# Patient Record
Sex: Male | Born: 1955 | Race: White | Hispanic: No | Marital: Married | Smoking: Current every day smoker
Health system: Southern US, Community
[De-identification: ages and names within clinical notes are randomized; demographics above are authoritative.]

## PROBLEM LIST (undated history)

## (undated) DIAGNOSIS — Z974 Presence of external hearing-aid: Secondary | ICD-10-CM

## (undated) DIAGNOSIS — I2510 Atherosclerotic heart disease of native coronary artery without angina pectoris: Secondary | ICD-10-CM

## (undated) DIAGNOSIS — I10 Essential (primary) hypertension: Secondary | ICD-10-CM

## (undated) DIAGNOSIS — I739 Peripheral vascular disease, unspecified: Secondary | ICD-10-CM

## (undated) DIAGNOSIS — M1990 Unspecified osteoarthritis, unspecified site: Secondary | ICD-10-CM

## (undated) DIAGNOSIS — I219 Acute myocardial infarction, unspecified: Secondary | ICD-10-CM

## (undated) DIAGNOSIS — D649 Anemia, unspecified: Secondary | ICD-10-CM

## (undated) HISTORY — DX: Essential (primary) hypertension: I10

## (undated) HISTORY — DX: Unspecified osteoarthritis, unspecified site: M19.90

## (undated) HISTORY — DX: Atherosclerotic heart disease of native coronary artery without angina pectoris: I25.10

---

## 2003-11-20 DIAGNOSIS — D649 Anemia, unspecified: Secondary | ICD-10-CM

## 2003-11-20 HISTORY — DX: Anemia, unspecified: D64.9

## 2010-07-27 ENCOUNTER — Ambulatory Visit: Payer: Self-pay | Admitting: Internal Medicine

## 2012-02-24 ENCOUNTER — Emergency Department: Payer: Self-pay | Admitting: Emergency Medicine

## 2013-11-19 DIAGNOSIS — I219 Acute myocardial infarction, unspecified: Secondary | ICD-10-CM

## 2013-11-19 HISTORY — DX: Acute myocardial infarction, unspecified: I21.9

## 2013-11-19 HISTORY — PX: CORONARY ANGIOPLASTY WITH STENT PLACEMENT: SHX49

## 2014-05-20 ENCOUNTER — Inpatient Hospital Stay: Payer: Self-pay | Admitting: Internal Medicine

## 2014-05-20 DIAGNOSIS — I214 Non-ST elevation (NSTEMI) myocardial infarction: Secondary | ICD-10-CM

## 2014-05-20 DIAGNOSIS — I2510 Atherosclerotic heart disease of native coronary artery without angina pectoris: Secondary | ICD-10-CM

## 2014-05-20 LAB — CBC
HCT: 41.4 % (ref 40.0–52.0)
HGB: 13.9 g/dL (ref 13.0–18.0)
MCH: 32.1 pg (ref 26.0–34.0)
MCHC: 33.5 g/dL (ref 32.0–36.0)
MCV: 96 fL (ref 80–100)
Platelet: 213 10*3/uL (ref 150–440)
RBC: 4.32 10*6/uL — ABNORMAL LOW (ref 4.40–5.90)
RDW: 13.6 % (ref 11.5–14.5)
WBC: 12 10*3/uL — AB (ref 3.8–10.6)

## 2014-05-20 LAB — CK TOTAL AND CKMB (NOT AT ARMC)
CK, Total: 244 U/L
CK-MB: 24.3 ng/mL — ABNORMAL HIGH (ref 0.5–3.6)

## 2014-05-20 LAB — COMPREHENSIVE METABOLIC PANEL
ALT: 20 U/L (ref 12–78)
ANION GAP: 4 — AB (ref 7–16)
Albumin: 3.8 g/dL (ref 3.4–5.0)
Alkaline Phosphatase: 63 U/L
BUN: 13 mg/dL (ref 7–18)
Bilirubin,Total: 0.5 mg/dL (ref 0.2–1.0)
CHLORIDE: 108 mmol/L — AB (ref 98–107)
CO2: 26 mmol/L (ref 21–32)
Calcium, Total: 9.1 mg/dL (ref 8.5–10.1)
Creatinine: 0.85 mg/dL (ref 0.60–1.30)
EGFR (Non-African Amer.): >60
Glucose: 140 mg/dL — ABNORMAL HIGH (ref 65–99)
OSMOLALITY: 278 (ref 275–301)
Potassium: 3.9 mmol/L (ref 3.5–5.1)
SGOT(AST): 30 U/L (ref 15–37)
SODIUM: 138 mmol/L (ref 136–145)
Total Protein: 7.5 g/dL (ref 6.4–8.2)

## 2014-05-20 LAB — LIPID PANEL
Cholesterol: 187 mg/dL (ref 0–200)
HDL Cholesterol: 46 mg/dL (ref 40–60)
Ldl Cholesterol, Calc: 125 mg/dL — ABNORMAL HIGH (ref 0–100)
TRIGLYCERIDES: 81 mg/dL (ref 0–200)
VLDL Cholesterol, Calc: 16 mg/dL (ref 5–40)

## 2014-05-20 LAB — CK-MB: CK-MB: 241.5 ng/mL — AB (ref 0.5–3.6)

## 2014-05-20 LAB — TROPONIN I
TROPONIN-I: 1.2 ng/mL — AB
Troponin-I: 29 ng/mL — ABNORMAL HIGH

## 2014-05-20 LAB — HEMOGLOBIN A1C: Hemoglobin A1C: 5.9 % (ref 4.2–6.3)

## 2014-05-21 LAB — BASIC METABOLIC PANEL
Anion Gap: 10 (ref 7–16)
BUN: 7 mg/dL (ref 7–18)
CO2: 22 mmol/L (ref 21–32)
Calcium, Total: 8.7 mg/dL (ref 8.5–10.1)
Chloride: 105 mmol/L (ref 98–107)
Creatinine: 0.68 mg/dL (ref 0.60–1.30)
EGFR (African American): >60
EGFR (Non-African Amer.): >60
GLUCOSE: 106 mg/dL — AB (ref 65–99)
OSMOLALITY: 272 (ref 275–301)
Potassium: 3.7 mmol/L (ref 3.5–5.1)
Sodium: 137 mmol/L (ref 136–145)

## 2014-05-21 LAB — CK TOTAL AND CKMB (NOT AT ARMC)
CK, Total: 2257 U/L — ABNORMAL HIGH
CK-MB: 276.1 ng/mL — ABNORMAL HIGH (ref 0.5–3.6)

## 2014-05-24 ENCOUNTER — Encounter: Payer: Self-pay | Admitting: Cardiovascular Disease

## 2014-05-24 DIAGNOSIS — E782 Mixed hyperlipidemia: Secondary | ICD-10-CM | POA: Insufficient documentation

## 2014-05-24 DIAGNOSIS — Z720 Tobacco use: Secondary | ICD-10-CM | POA: Insufficient documentation

## 2014-05-24 DIAGNOSIS — I252 Old myocardial infarction: Secondary | ICD-10-CM | POA: Insufficient documentation

## 2014-06-30 ENCOUNTER — Encounter: Payer: Self-pay | Admitting: Cardiovascular Disease

## 2015-01-11 ENCOUNTER — Emergency Department: Payer: Self-pay | Admitting: Emergency Medicine

## 2015-03-12 NOTE — Consult Note (Signed)
PATIENT NAME:  Gavin Mendez, NULPH MR#:  [id number] DATE OF BIRTH:  14-Jan-1956  DATE OF CONSULTATION:  05/20/2014  REFERRING PHYSICIAN:  Janalyn Harder, MD CONSULTING PHYSICIAN:  Lamar Blinks, MD  REASON FOR CONSULTATION: Acute subendocardial myocardial infarction, chest pain and tobacco use.   CHIEF COMPLAINT: "I have shortness of breath and weakness."  HISTORY OF PRESENT ILLNESS: This is a 59 year old male with significant tobacco use, borderline hypertension, hyperlipidemia and family history of cardiovascular disease who has had new onset of back pain, bilateral arm pain, weakness, shortness of breath, nausea, and diaphoresis over a 3 to 4 hour period throughout the night, slightly improved at this time, multifactorial in nature, although most consistent with acute subendocardial myocardial infarction with a troponin of 1.2 and an EKG showing normal sinus rhythm with slight nonspecific elevation of anterior precordial leads in the ST segments. The patient is much better at this time but still has some right-sided discomfort.   REVIEW OF SYSTEMS: The remainder of review is negative for vision change, ringing in the ears, hearing loss, cough, congestion, heartburn, diarrhea, bloody stool, stomach pain, extremity pain, leg weakness, cramping of the buttocks, known blood clots, headaches, blackouts, dizzy spells, nosebleeds, congestion, trouble swallowing, frequent urination, urination at night, muscle weakness, numbness, anxiety, depression, skin lesions or skin rashes.   PAST MEDICAL HISTORY: Borderline hypertension and borderline hyperlipidemia.   FAMILY HISTORY: Multiple family members with early onset of cardiovascular disease and stenting.   SOCIAL HISTORY: Smokes 1 to 2 packs per day. No alcohol use.   ALLERGIES: As listed.   MEDICATIONS: As listed.   PHYSICAL EXAMINATION: VITAL SIGNS: Blood pressure is 148/62 bilaterally and heart rate 72 upright and reclining and regular.  GENERAL:  He is a well appearing male in no acute distress.  HEENT: No icterus, thyromegaly, ulcers, hemorrhage, or xanthelasma.  CARDIOVASCULAR: Regular rate and rhythm. Normal S1 and S2 without murmur, gallop or rub. PMI is normal size and placement. Carotid upstroke normal without bruit. Jugular venous pressure is normal.  LUNGS: Clear to auscultation with normal respirations.  ABDOMEN: Soft and nontender without hepatosplenomegaly or masses. Abdominal aorta is normal size without bruit.  EXTREMITIES: 2+ radial, femoral, and dorsal pedal pulses with no lower extremity edema, cyanosis, clubbing or ulcers.  NEUROLOGIC: He is oriented to time, place, and person with normal mood and affect.   ASSESSMENT: A 59 year old male with acute subendocardial myocardial infarction, borderline hypertension and hyperlipidemia needing further medication management.   RECOMMENDATIONS: 1.  Heparin, aspirin and nitrates for hypertension and acute myocardial infarction.  2.  Serial ECG and enzymes to assess extent of myocardial infarction.  3.  Proceed to cardiac catheterization to assess coronary anatomy and further treatment thereof as necessary. The patient understands the risks and benefits of cardiac catheterization. These include the possibility of death, stroke, heart attack, infection, bleeding, and blood clot. He is at low risk for conscious sedation.    ____________________________ Lamar Blinks, MD bjk:sb D: 05/20/2014 10:27:36 ET T: 05/20/2014 10:47:25 ET JOB#: [id number]  cc: Lamar Blinks, MD, <Dictator> Lamar Blinks MD ELECTRONICALLY SIGNED 05/24/2014 13:22

## 2015-03-12 NOTE — Discharge Summary (Signed)
PATIENT NAME:  Gavin Mendez, Gavin Mendez MR#:  [id number] DATE OF BIRTH:  [birth year]  DATE OF ADMISSION:  05/20/2014 DATE OF DISCHARGE:  05/21/2014  DISCHARGE DIAGNOSES:  1.  Non-ST-elevated myocardial infarction. 2.  Left anterior descending and first diagonal occlusion post stents.  3.  Tobacco abuse.   DISCHARGE MEDICATIONS: Per [facility] medication reconciliation. Basically, he will be on Lipitor, Toprol-XL, lisinopril, aspirin, and 10 mg of Effient tablet.   HISTORY AND PHYSICAL: Please see detailed history and physical on admission.  HOSPITAL COURSE:   The patient was admitted with bilateral arm pain, shortness of breath, ruled in for myocardial infarction. Cardiology was consulted. Cardiac catheter was done with the above being found since being put in. EF 35%-40% by echocardiogram, so long acting metoprolol and lisinopril were begun. He will slowly increase his activity.  He will follow up soon with cardiology for further evaluation and treatment above.     ____________________________ Marya Amsler. Dareen Piano, MD mwa:ts D: 05/21/2014 09:29:04 ET T: 05/21/2014 19:52:21 ET JOB#: [id number]  cc: Marya Amsler. Dareen Piano, MD, <Dictator> Lauro Regulus MD ELECTRONICALLY SIGNED 05/25/2014 7:47

## 2015-03-12 NOTE — H&P (Signed)
PATIENT NAME:  Gavin Mendez, Gavin Mendez MR#:  [id number] DATE OF BIRTH:  08/23/1956  DATE OF ADMISSION:  05/20/2014  PRIMARY CARE PHYSICIAN: Marya Amsler. Dareen Piano, MD  CHIEF COMPLAINT: Bilateral arm pain with nausea and diaphoresis today.   HISTORY OF PRESENT ILLNESS: Gavin Mendez is a 59 year old Caucasian gentleman who has history of hypertension, not on any medications, who comes in after he woke up in the middle of the night complaining of bilateral arm pain along with nausea, diaphoresis, and could not go back to sleep. He was very anxious when he came to the Emergency Room. He ruled in for non-STEMI. He has troponin of 1.20. The patient denies any chest pain per se. No shortness of breath. He did have some nausea earlier. He received aspirin, nitroglycerin, and I have ordered for him to get some beta blockers. The patient is going to be taken to the cardiac catheterization lab by Dr. Gwen Pounds later this afternoon. Lovenox has not been administered.   PAST MEDICAL HISTORY: Hypertension.   MEDICATIONS: None.   ALLERGIES: FLU VACCINE.   FAMILY HISTORY: Positive for stroke, diabetes and hypertension.   SOCIAL HISTORY: Continues to smoke about 1/2 to 1 pack a day. I have advised him on smoking cessation, about 3 minutes spent. He does seem to be motivated. Denies any alcohol or any other drug use.   REVIEW OF SYSTEMS:  CONSTITUTIONAL: No fever, fatigue, weakness.  EYES: No blurred or double vision, glaucoma or cataracts.  ENT: No tinnitus, ear pain, hearing loss.  RESPIRATORY: No cough, wheeze, hemoptysis or dyspnea.  CARDIOVASCULAR: No chest pain, orthopnea, edema or arrhythmia.  GASTROINTESTINAL: No nausea, vomiting, diarrhea, abdominal pain or GERD.  GENITOURINARY: No dysuria, hematuria or frequency.  ENDOCRINE: No polyuria, nocturia or thyroid problems.  HEMATOLOGY: No anemia or easy bruising or bleeding.  SKIN: No acne, rash, lesion.  MUSCULOSKELETAL: No arthritis, cramps, gout or swelling.   NEUROLOGIC: No CVA, TIA, seizures or dementia.  PSYCHIATRIC: No anxiety, insomnia, depression or bipolar disorder.  All other systems reviewed and negative.   PHYSICAL EXAMINATION:  GENERAL: The patient is awake, alert, oriented x3.  VITAL SIGNS: Afebrile, pulse is 59, blood pressure is 145/83, saturations are 99% on room air.  HEENT: Atraumatic, normocephalic. Pupils are equal, round and reactive to light and accommodation. EOM intact. Oral mucosa is moist.  NECK: Supple. No JVD. No carotid bruit.  RESPIRATORY: Clear to auscultation bilaterally. No rales, rhonchi, respiratory distress or labored breathing.  CARDIOVASCULAR: Both the heart sounds are normal. Rate and rhythm regular. PMI not lateralized. Chest nontender. Good pedal pulses, good femoral pulses. No lower extremity edema.  SKIN: Warm and dry.  PSYCHIATRIC: The patient is awake, alert, oriented x3.   DIAGNOSTIC DATA: EKG shows normal sinus rhythm with mild ST-T changes in lateral leads.  Troponin is 1.20. CPK MB is 24.3.  Chest x-ray: No active cardiopulmonary disease.  Glucose is 140.  H and H is 13.9 and 41.4.   ASSESSMENT AND PLAN: The 59 year old Gavin Mendez with history of hypertension, not on any medicines, is being admitted for:   1. Acute non-ST elevation myocardial infarction. The patient came in with bilateral arm pain, nausea, diaphoresis and has ruled in for non-ST elevation myocardial infarction. His first troponin is 1.20. Will continue aspirin, nitro paste, p.r.n. morphine and put the patient on Lipitor. Metoprolol has been started. The patient was seen by Dr. Gwen Pounds and is scheduled to get cardiac catheterization. His Lovenox. Hence, Lovenox is held. Will continue to monitor cardiac  enzymes x3.  2. Hyperglycemia. No history of diabetes; however, the patient does have family history of diabetes. Will do sliding scale for insulin coverage at this time. Check A1c. If it is elevated, consider starting the patient  on antidiabetic.  3. Hypertension, currently on beta blockers.  4. Deep vein thrombosis prophylaxis. Will start either heparin or Lovenox depending on catheterization results.  5. Further workup according to the patient's clinical course.   Hospital admission plan was discussed with the patient, who is agreeable to it.   TIME SPENT: 50 minutes.   ____________________________ Wylie Hail Allena Katz, MD sap:lb D: 05/20/2014 11:02:54 ET T: 05/20/2014 11:15:18 ET JOB#: [id number]  cc: [name] A. Allena Katz, MD, <Dictator> Marya Amsler. Dareen Piano, MD Willow Ora MD ELECTRONICALLY SIGNED 05/22/2014 10:35
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
## 2015-03-20 NOTE — Consult Note (Signed)
PATIENT NAME:  Gavin Mendez, COFFIELD MR#:  [id number] DATE OF BIRTH:  Mar 03, 1956  DATE OF CONSULTATION:  01/11/2015  REFERRING PHYSICIAN:   CONSULTING PHYSICIAN:  Lamar Blinks, MD  CONSULTING PHYSICIAN:  [place]  REASON FOR CONSULTATION: Syncope with supraventricular tachycardia, coronary artery disease, and cardiomyopathy.   CHIEF COMPLAINT:  Passed out and was dizzy.   HISTORY OF PRESENT ILLNESS: This is a 59 year old male with known coronary artery disease status post coronary artery stenting and anterior myocardial infarction with a recent echocardiogram showing mild to moderate LV systolic dysfunction with anterior and apical hypokinesis. The patient has been on appropriate ACE inhibitor and beta blocker as well as antiplatelet medication management and doing well. Recently he had a significant episode of dehydration and diarrhea and concerns of food poisoning for which he had had some concerns on Monday. He did not hydrate quite as well, but was doing a little bit better. 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 At that time he was taken to the hospital and at that time an EKG showed normal sinus rhythm, normal EKG, but there were some rhythm strips did show some supraventricular tachycardia which were narrow complex at approximately 120 beats per minute and very short lived. These have completely abated at this time after the patient has had continued IV hydration over 1.5 liters. He has not had any chest pain or other significant symptoms and he has no concerns of heart failure, and he is feeling much better. The patient does have normal troponin at this time.   REVIEW OF SYSTEMS: The remainder of review of systems negative for vision change, ringing in the ears, hearing loss, cough, congestion, heartburn, nausea,  vomiting, diarrhea, bloody stools, stomach pain, extremity pain, leg weakness, cramping of the buttocks, known blood clots, headaches, nosebleeds, congestion, trouble swallowing, frequent urination, urination at night, muscle weakness, numbness, anxiety, depression, skin lesions, or skin rashes.   PAST MEDICAL HISTORY:  1. Coronary artery disease status post stenting and myocardial infarction.  2. Mild dilated cardiomyopathy.  3. Hyperlipidemia.   FAMILY HISTORY: Multiple family history with hypertension and coronary artery disease.   SOCIAL HISTORY: Currently does have some alcohol use and rarely drinks.  Is having tobacco use.   ALLERGIES: AS LISTED.   MEDICATIONS: As listed.   PHYSICAL EXAMINATION:  VITAL SIGNS: Blood pressure is 122/68 bilaterally, was lower in the  105 region earlier, heart rate is 76 upright, reclining, and regular.  GENERAL: He is a well-appearing male in no acute distress.  HEENT: No icterus, thyromegaly, ulcers, hemorrhage, or xanthelasma.  CARDIOVASCULAR: Regular rate and rhythm. Normal S1, S2 without murmur, gallop, or rub. PMI is diffuse. Carotid upstroke normal without bruit. Jugular venous pressure is normal.  LUNGS: Clear to auscultation with normal respirations.  ABDOMEN: Soft, nontender, without hepatosplenomegaly or masses. Abdominal aorta is normal size without bruit.  EXTREMITIES: Show 2 + radial, femoral, dorsal pedal pulses with no lower extremity edema, cyanosis, clubbing. or ulcers.  NEUROLOGIC: He is oriented to time, place, and person, with normal mood and affect.   ASSESSMENT: A 59 year old male with known coronary disease status post percutaneous coronary intervention and stent placement, anterior apical myocardial infarction, with mild to moderate left ventricular systolic dysfunction on appropriate medications with dehydration and medication management causing dizziness and syncope,  now recovered without evidence of myocardial infarction    RECOMMENDATIONS:  1.  Discontinue lisinopril during periods of time now that the patient has dehydration and weakness.  2.  Continue observation for the next hour for any supraventricular tachycardia. If completely resolved and the patient is rehydrated and ambulating well he may be able to be discharged home with followup with Dr. Gwen Pounds on Friday.  3.  Continue metoprolol for any tachycardia or other cardiomyopathy type symptoms. 4.  No further cardiac intervention or diagnostics necessary at this time with normal troponin. 5.  Continue antiplatelet medication management for further risk reduction of in-stent thrombosis.  6.  Lipid management for high intensity cholesterol therapy.     ____________________________ Lamar Blinks, MD bjk:bu D: 01/11/2015 17:18:49 ET T: 01/11/2015 20:47:04 ET JOB#: [id number]  cc: Lamar Blinks, MD, <Dictator> Lamar Blinks MD ELECTRONICALLY SIGNED 01/14/2015 8:31

## 2015-04-22 DIAGNOSIS — I10 Essential (primary) hypertension: Secondary | ICD-10-CM | POA: Insufficient documentation

## 2015-10-18 IMAGING — CR DG CHEST 1V PORT
1 series · 1 of 1 positions shown · non-contrast
Comparison: 05/20/2014

CLINICAL DATA: Dizziness, syncope, irregular heart rate

EXAM:
PORTABLE CHEST - 1 VIEW

[ap]
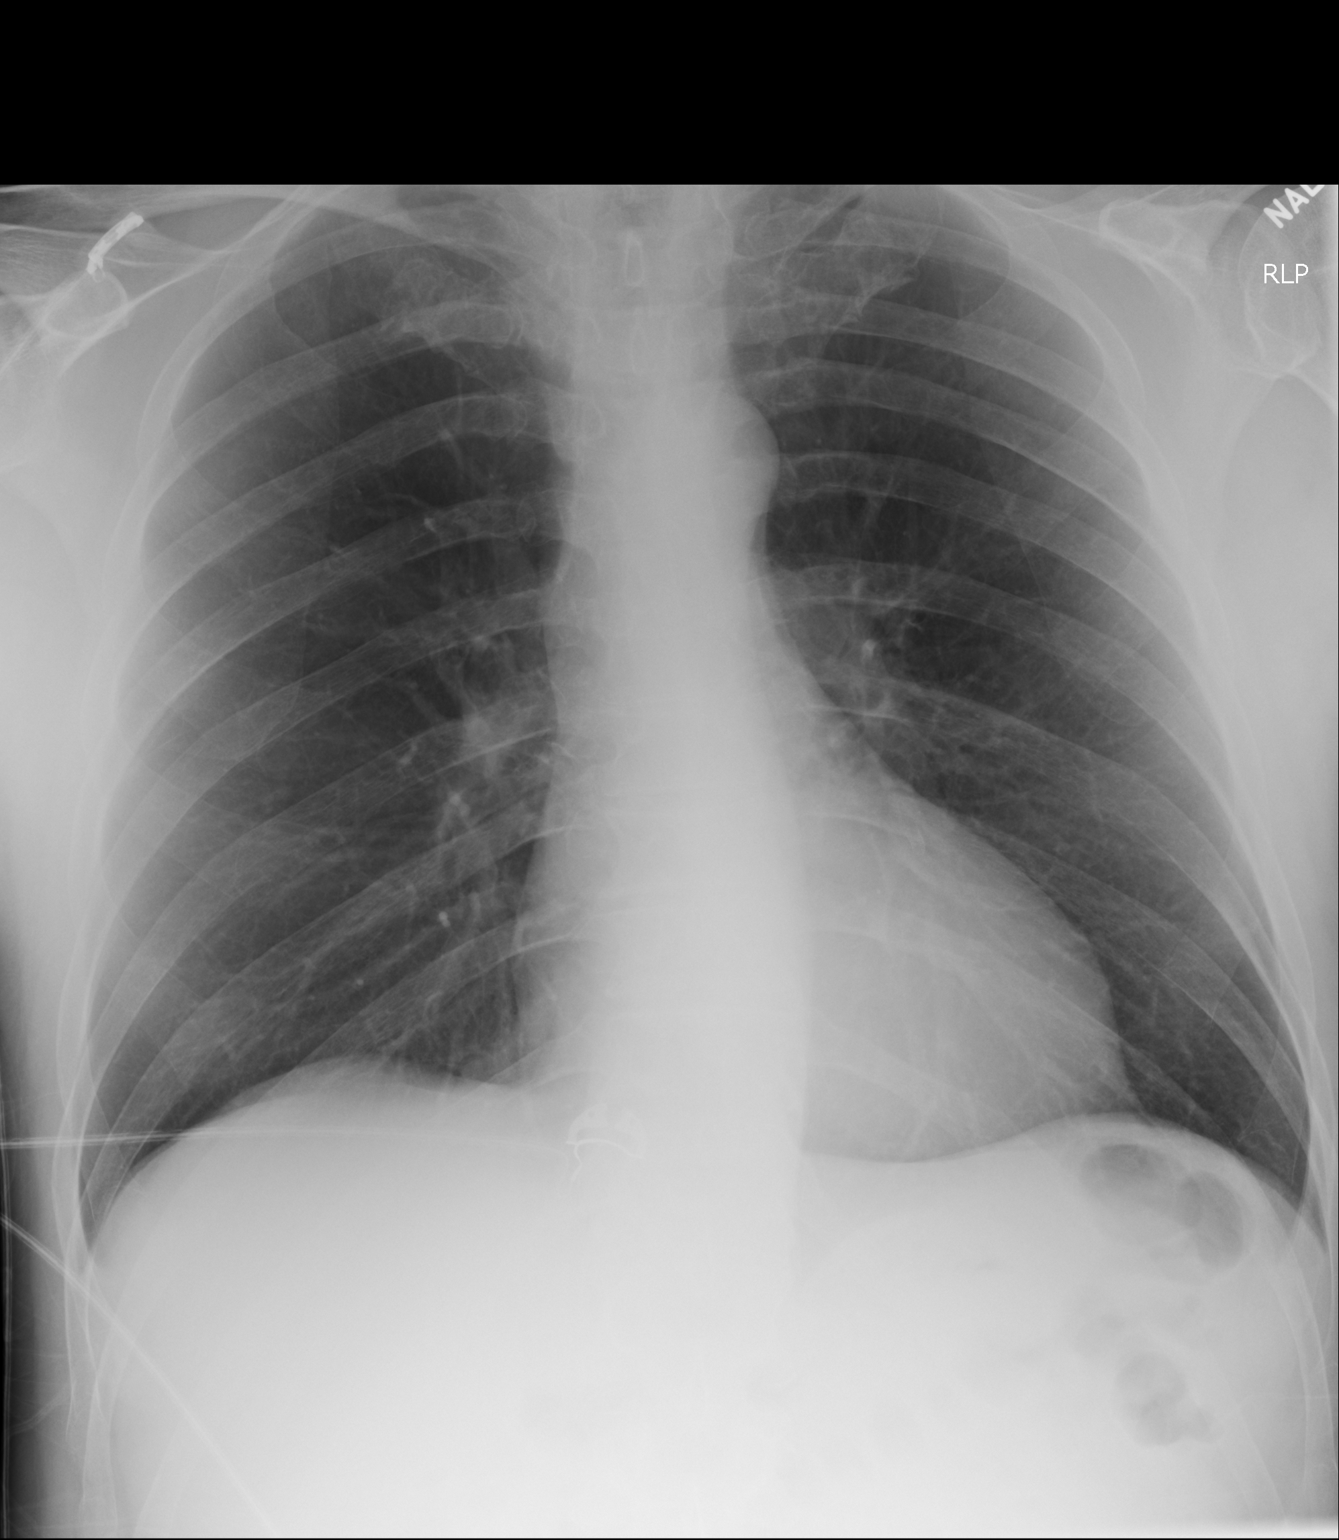

[1 of 1 positions shown; findings below may reference images not displayed]

FINDINGS: Cardiomediastinal silhouette is stable. No acute infiltrate or
pleural effusion. No pulmonary edema. Bony thorax is unremarkable.
IMPRESSION: No active disease.

## 2018-12-29 DIAGNOSIS — E782 Mixed hyperlipidemia: Secondary | ICD-10-CM | POA: Diagnosis not present

## 2018-12-29 DIAGNOSIS — I25118 Atherosclerotic heart disease of native coronary artery with other forms of angina pectoris: Secondary | ICD-10-CM | POA: Diagnosis not present

## 2018-12-29 DIAGNOSIS — I70219 Atherosclerosis of native arteries of extremities with intermittent claudication, unspecified extremity: Secondary | ICD-10-CM | POA: Insufficient documentation

## 2018-12-29 DIAGNOSIS — I10 Essential (primary) hypertension: Secondary | ICD-10-CM | POA: Diagnosis not present

## 2019-01-12 DIAGNOSIS — Z0000 Encounter for general adult medical examination without abnormal findings: Secondary | ICD-10-CM | POA: Diagnosis not present

## 2019-01-12 DIAGNOSIS — I25118 Atherosclerotic heart disease of native coronary artery with other forms of angina pectoris: Secondary | ICD-10-CM | POA: Diagnosis not present

## 2019-01-12 DIAGNOSIS — I70219 Atherosclerosis of native arteries of extremities with intermittent claudication, unspecified extremity: Secondary | ICD-10-CM | POA: Diagnosis not present

## 2019-01-12 DIAGNOSIS — Z23 Encounter for immunization: Secondary | ICD-10-CM | POA: Diagnosis not present

## 2019-01-16 ENCOUNTER — Other Ambulatory Visit: Payer: Self-pay

## 2019-01-16 ENCOUNTER — Encounter: Payer: Self-pay | Admitting: Nurse Practitioner

## 2019-01-16 ENCOUNTER — Ambulatory Visit: Payer: BLUE CROSS/BLUE SHIELD | Admitting: Nurse Practitioner

## 2019-01-16 VITALS — BP 141/73 | HR 79 | Temp 98.0°F | Resp 18 | Ht 69.0 in | Wt 175.0 lb

## 2019-01-16 DIAGNOSIS — H6591 Unspecified nonsuppurative otitis media, right ear: Secondary | ICD-10-CM

## 2019-01-16 DIAGNOSIS — H6122 Impacted cerumen, left ear: Secondary | ICD-10-CM

## 2019-01-16 NOTE — Patient Instructions (Addendum)
Hearing loss due to cerumen impaction, left  Other nonsuppurative otitis media of right ear, unspecified chronicity   -Get over the counter fexofenadine and take as directed until symptoms resolve -Encouraged patient to call the office or primary care doctor for an appointment if no improvement in symptoms or if symptoms change or worsen after 72 hours of planned treatment. Patient verbalized understanding of all instructions given/reviewed and has no further questions or concerns at this time.

## 2019-01-16 NOTE — Progress Notes (Signed)
    Subjective:    Patient ID: Gavin Mendez, male    DOB: 06-07-1956, 63 y.o.   MRN: [id number]  HPI Gavin Mendez is here today with complaints of feeling bilateral ear fullness and concerns of wax buildup for 1 month.  He does admit he was seen by his PCP in the last several weeks in which he attempted to remove some but feels there is still some wax in his ear because of the symptoms that he has.  He reports he is not had any earwax issues for over 30 years.  He was sick at the end of January with a cold in which he had treatment and has no current respiratory symptoms but the ones of concern today. He has tried over-the-counter earwax kits which includes sea water combo, peroxide and a generic Debrox kit which he has gotten some wax out. Reports he feels his balance has been off during this time but denies dizziness. Denies cough, sore throat, fever, shortness of breath, chest pain, facial pain or ear pain.   Review of Systems  HENT:       Ear pain and ear fullness       Objective:   Physical Exam Constitutional:      Appearance: Normal appearance.  HENT:     Head: Normocephalic and atraumatic.     Comments: Unable to visualize the left TM due to moderate amount of brown cerumen.  Right EAC clear with a dull TM with no erythema and but notable serous fluid.    Right Ear: Ear canal normal.  Neck:     Musculoskeletal: Normal range of motion and neck supple.  Skin:    General: Skin is warm and dry.  Neurological:     General: No focal deficit present.     Mental Status: He is alert.  Psychiatric:        Mood and Affect: Mood normal.           Assessment & Plan:

## 2019-01-18 DIAGNOSIS — M79606 Pain in leg, unspecified: Secondary | ICD-10-CM | POA: Insufficient documentation

## 2019-01-18 DIAGNOSIS — I70229 Atherosclerosis of native arteries of extremities with rest pain, unspecified extremity: Secondary | ICD-10-CM | POA: Insufficient documentation

## 2019-01-18 DIAGNOSIS — E785 Hyperlipidemia, unspecified: Secondary | ICD-10-CM | POA: Insufficient documentation

## 2019-01-18 DIAGNOSIS — I10 Essential (primary) hypertension: Secondary | ICD-10-CM | POA: Insufficient documentation

## 2019-01-18 NOTE — Progress Notes (Signed)
MRN : [id number]  Gavin Mendez is a 63 y.o. (1956/08/06) male who presents with chief complaint of No chief complaint on file. Marland Kitchen  History of Present Illness:    The patient is seen for evaluation of painful left lower extremity and diminished pulses. Patient notes the pain is always associated with activity and is very consistent day today. The pain began abruptly in the last week of January.  He note it becomes much worse when he lays down and tries to sleep.  Typically, the pain intensifies with walking particularly at less than one block, the pain progress as activity continues to the point that the patient must stop walking. Resting including standing still for several minutes allowed resumption of the activity and the ability to walk a similar distance before stopping again. Uneven terrain and inclined shorten the distance. The pain has been progressive over the past several years. The patient states the inability to walk is now having a profound negative impact on quality of life and daily activities.  The patient describes rest pain. No open wounds or sores at this time. No prior interventions or surgeries.  No history of back problems or DJD of the lumbar sacral spine.   The patient denies changes in claudication symptoms or new rest pain symptoms.  No new ulcers or wounds of the foot.  The patient's blood pressure has been stable and relatively well controlled. The patient denies amaurosis fugax or recent TIA symptoms. There are no recent neurological changes noted. The patient denies history of DVT, PE or superficial thrombophlebitis. The patient denies recent episodes of angina or shortness of breath.   ABI's Rt= 1.04 triphasic and Lt=0.35 monophasic  No outpatient medications have been marked as taking for the 01/19/19 encounter (Appointment) with Gilda Crease, Latina Craver, MD.    Past Medical History:  Diagnosis Date  . Arthritis   . Cardiovascular disease   . High blood  pressure     Past Surgical History:  Procedure Laterality Date  . CORONARY ANGIOPLASTY WITH STENT PLACEMENT  2015    Social History Social History   Tobacco Use  . Smoking status: Current Every Day Smoker    Packs/day: 1.00    Years: 45.00    Pack years: 45.00    Types: Cigarettes  . Smokeless tobacco: Never Used  Substance Use Topics  . Alcohol use: Never    Frequency: Never  . Drug use: Never    Family History No family history on file.  No family history of bleeding/clotting disorders, porphyria or autoimmune disease   Allergies  Allergen Reactions  . Influenza A (H1n1) Monovalent Vaccine Cough    Cough, fever      REVIEW OF SYSTEMS (Negative unless checked)  Constitutional: [] Weight loss  [] Fever  [] Chills Cardiac: [x] Chest pain   [] Chest pressure   [] Palpitations   [] Shortness of breath when laying flat   [] Shortness of breath with exertion. Vascular:  [x] Pain in legs with walking   [x] Pain in legs at rest  [] History of DVT   [] Phlebitis   [] Swelling in legs   [] Varicose veins   [] Non-healing ulcers Pulmonary:   [] Uses home oxygen   [] Productive cough   [] Hemoptysis   [] Wheeze  [] COPD   [] Asthma Neurologic:  [] Dizziness   [] Seizures   [] History of stroke   [] History of TIA  [] Aphasia   [] Vissual changes   [] Weakness or numbness in arm   [] Weakness or numbness in leg Musculoskeletal:   [] Joint swelling   []   Joint pain   [] Low back pain Hematologic:  [] Easy bruising  [] Easy bleeding   [] Hypercoagulable state   [] Anemic Gastrointestinal:  [] Diarrhea   [] Vomiting  [] Gastroesophageal reflux/heartburn   [] Difficulty swallowing. Genitourinary:  [] Chronic kidney disease   [] Difficult urination  [] Frequent urination   [] Blood in urine Skin:  [] Rashes   [] Ulcers  Psychological:  [] History of anxiety   []  History of major depression.  Physical Examination  There were no vitals filed for this visit. There is no height or weight on file to calculate BMI. Gen: WD/WN,  NAD Head: Concrete/AT, No temporalis wasting.  Ear/Nose/Throat: Hearing grossly intact, nares w/o erythema or drainage, poor dentition Eyes: PER, EOMI, sclera nonicteric.  Neck: Supple, no masses.  No bruit or JVD.  Pulmonary:  Good air movement, clear to auscultation bilaterally, no use of accessory muscles.  Cardiac: RRR, normal S1, S2, no Murmurs. Vascular:  Vessel Right Left  Radial Palpable Palpable  PT Palpable Not Palpable  DP Palpable Not Palpable  Gastrointestinal: soft, non-distended. No guarding/no peritoneal signs.  Musculoskeletal: M/S 5/5 throughout.  No deformity or atrophy.  Neurologic: CN 2-12 intact. Pain and light touch intact in extremities.  Symmetrical.  Speech is fluent. Motor exam as listed above. Psychiatric: Judgment intact, Mood & affect appropriate for pt's clinical situation. Dermatologic: No rashes or ulcers noted.  No changes consistent with cellulitis. Lymph : No Cervical lymphadenopathy, no lichenification or skin changes of chronic lymphedema.  CBC Lab Results  Component Value Date   WBC 12.0 (H) 05/20/2014   HGB 13.9 05/20/2014   HCT 41.4 05/20/2014   MCV 96 05/20/2014   PLT 213 05/20/2014    BMET    Component Value Date/Time   NA 137 05/21/2014 0405   K 3.7 05/21/2014 0405   CL 105 05/21/2014 0405   CO2 22 05/21/2014 0405   GLUCOSE 106 (H) 05/21/2014 0405   BUN 7 05/21/2014 0405   CREATININE 0.68 05/21/2014 0405   CALCIUM 8.7 05/21/2014 0405   GFRNONAA >60 05/21/2014 0405   GFRAA >60 05/21/2014 0405   CrCl cannot be calculated (Patient's most recent lab result is older than the maximum 21 days allowed.).  COAG No results found for: INR, PROTIME  Radiology No results found.   Assessment/Plan 1. Atherosclerosis of artery of extremity with rest pain (HCC) Recommend:  The patient has evidence of severe atherosclerotic changes of the left lower extremity with rest pain that is associated with preulcerative changes and impending tissue  loss of the left foot.  This represents left limb threatening ischemia and places the patient at the risk for limb loss.  Patient should undergo angiography of the left lower extremity with the hope for intervention for limb salvage.  The risks and benefits as well as the alternative therapies was discussed in detail with the patient.  All questions were answered.  Patient agrees to proceed with left leg angiography.  The patient will follow up with me in the office after the procedure.   2. Pain of left lower extremity Recommend:  The patient has evidence of severe atherosclerotic changes of the left lower extremity with rest pain that is associated with preulcerative changes and impending tissue loss of the left foot.  This represents left limb threatening ischemia and places the patient at the risk for limb loss.  Patient should undergo angiography of the left lower extremity with the hope for intervention for limb salvage.  The risks and benefits as well as the alternative therapies was discussed  in detail with the patient.  All questions were answered.  Patient agrees to proceed with left leg angiography.  The patient will follow up with me in the office after the procedure.   3. Essential hypertension Continue antihypertensive medications as already ordered, these medications have been reviewed and there are no changes at this time.   4. Mixed hyperlipidemia Continue statin as ordered and reviewed, no changes at this time   5. Coronary artery disease of native artery of native heart with stable angina pectoris (HCC) Continue cardiac and antihypertensive medications as already ordered and reviewed, no changes at this time.  Continue statin as ordered and reviewed, no changes at this time  Nitrates PRN for chest pain     Levora Dredge, MD  01/18/2019 3:48 PM

## 2019-01-19 ENCOUNTER — Telehealth (INDEPENDENT_AMBULATORY_CARE_PROVIDER_SITE_OTHER): Payer: Self-pay

## 2019-01-19 ENCOUNTER — Encounter (INDEPENDENT_AMBULATORY_CARE_PROVIDER_SITE_OTHER): Payer: Self-pay | Admitting: Vascular Surgery

## 2019-01-19 ENCOUNTER — Ambulatory Visit (INDEPENDENT_AMBULATORY_CARE_PROVIDER_SITE_OTHER): Payer: BLUE CROSS/BLUE SHIELD | Admitting: Vascular Surgery

## 2019-01-19 ENCOUNTER — Encounter (INDEPENDENT_AMBULATORY_CARE_PROVIDER_SITE_OTHER): Payer: Self-pay

## 2019-01-19 ENCOUNTER — Ambulatory Visit (INDEPENDENT_AMBULATORY_CARE_PROVIDER_SITE_OTHER): Payer: BLUE CROSS/BLUE SHIELD

## 2019-01-19 ENCOUNTER — Other Ambulatory Visit (INDEPENDENT_AMBULATORY_CARE_PROVIDER_SITE_OTHER): Payer: Self-pay | Admitting: Vascular Surgery

## 2019-01-19 DIAGNOSIS — I739 Peripheral vascular disease, unspecified: Secondary | ICD-10-CM | POA: Diagnosis not present

## 2019-01-19 DIAGNOSIS — I25118 Atherosclerotic heart disease of native coronary artery with other forms of angina pectoris: Secondary | ICD-10-CM

## 2019-01-19 DIAGNOSIS — I2510 Atherosclerotic heart disease of native coronary artery without angina pectoris: Secondary | ICD-10-CM | POA: Insufficient documentation

## 2019-01-19 DIAGNOSIS — E782 Mixed hyperlipidemia: Secondary | ICD-10-CM | POA: Diagnosis not present

## 2019-01-19 DIAGNOSIS — I70222 Atherosclerosis of native arteries of extremities with rest pain, left leg: Secondary | ICD-10-CM

## 2019-01-19 DIAGNOSIS — I70229 Atherosclerosis of native arteries of extremities with rest pain, unspecified extremity: Secondary | ICD-10-CM

## 2019-01-19 DIAGNOSIS — M79605 Pain in left leg: Secondary | ICD-10-CM

## 2019-01-19 DIAGNOSIS — F17210 Nicotine dependence, cigarettes, uncomplicated: Secondary | ICD-10-CM

## 2019-01-19 DIAGNOSIS — I10 Essential (primary) hypertension: Secondary | ICD-10-CM | POA: Diagnosis not present

## 2019-01-19 NOTE — Telephone Encounter (Signed)
Patient was given the pre-procedure instructions for his angio with Dr. Gilda Crease. Instructions were gone over, his angio is scheduled for 01/23/2019 with a 9:00 am arrival time.

## 2019-01-21 ENCOUNTER — Other Ambulatory Visit (INDEPENDENT_AMBULATORY_CARE_PROVIDER_SITE_OTHER): Payer: Self-pay | Admitting: Nurse Practitioner

## 2019-01-22 ENCOUNTER — Encounter
Admission: RE | Admit: 2019-01-22 | Discharge: 2019-01-22 | Disposition: A | Discharge status: Home / Self Care | Payer: BLUE CROSS/BLUE SHIELD | Source: Ambulatory Visit | Attending: Vascular Surgery | Admitting: Vascular Surgery

## 2019-01-22 ENCOUNTER — Other Ambulatory Visit (INDEPENDENT_AMBULATORY_CARE_PROVIDER_SITE_OTHER): Payer: Self-pay | Admitting: Nurse Practitioner

## 2019-01-22 ENCOUNTER — Other Ambulatory Visit: Payer: Self-pay

## 2019-01-22 DIAGNOSIS — Z01812 Encounter for preprocedural laboratory examination: Secondary | ICD-10-CM | POA: Diagnosis not present

## 2019-01-22 HISTORY — DX: Acute myocardial infarction, unspecified: I21.9

## 2019-01-22 HISTORY — DX: Peripheral vascular disease, unspecified: I73.9

## 2019-01-22 HISTORY — DX: Anemia, unspecified: D64.9

## 2019-01-22 LAB — CREATININE, SERUM
Creatinine, Ser: 0.67 mg/dL (ref 0.61–1.24)
GFR calc non Af Amer: >60 mL/min (ref >60)

## 2019-01-22 LAB — BUN: BUN: 15 mg/dL (ref 8–23)

## 2019-01-22 MED ORDER — CEFAZOLIN SODIUM-DEXTROSE 2-4 GM/100ML-% IV SOLN
2.0000 g | Freq: Once | INTRAVENOUS | Status: AC
  Administered 2019-01-23: 2 g via INTRAVENOUS

## 2019-01-23 ENCOUNTER — Encounter: Payer: Self-pay | Admitting: Certified Registered"

## 2019-01-23 ENCOUNTER — Ambulatory Visit
Admission: RE | Admit: 2019-01-23 | Discharge: 2019-01-23 | Disposition: A | Discharge status: Home / Self Care | Payer: BLUE CROSS/BLUE SHIELD | Attending: Vascular Surgery | Admitting: Vascular Surgery

## 2019-01-23 ENCOUNTER — Other Ambulatory Visit: Payer: Self-pay

## 2019-01-23 ENCOUNTER — Encounter
Admission: RE | Disposition: A | Discharge status: Home / Self Care | Payer: Self-pay | Source: Home / Self Care | Attending: Vascular Surgery

## 2019-01-23 DIAGNOSIS — Z887 Allergy status to serum and vaccine status: Secondary | ICD-10-CM | POA: Insufficient documentation

## 2019-01-23 DIAGNOSIS — Z955 Presence of coronary angioplasty implant and graft: Secondary | ICD-10-CM | POA: Insufficient documentation

## 2019-01-23 DIAGNOSIS — I10 Essential (primary) hypertension: Secondary | ICD-10-CM | POA: Insufficient documentation

## 2019-01-23 DIAGNOSIS — I25118 Atherosclerotic heart disease of native coronary artery with other forms of angina pectoris: Secondary | ICD-10-CM | POA: Diagnosis not present

## 2019-01-23 DIAGNOSIS — I70222 Atherosclerosis of native arteries of extremities with rest pain, left leg: Secondary | ICD-10-CM | POA: Insufficient documentation

## 2019-01-23 DIAGNOSIS — F17210 Nicotine dependence, cigarettes, uncomplicated: Secondary | ICD-10-CM | POA: Diagnosis not present

## 2019-01-23 DIAGNOSIS — I739 Peripheral vascular disease, unspecified: Secondary | ICD-10-CM

## 2019-01-23 DIAGNOSIS — E782 Mixed hyperlipidemia: Secondary | ICD-10-CM | POA: Diagnosis not present

## 2019-01-23 DIAGNOSIS — M1990 Unspecified osteoarthritis, unspecified site: Secondary | ICD-10-CM | POA: Diagnosis not present

## 2019-01-23 HISTORY — PX: LOWER EXTREMITY ANGIOGRAPHY: CATH118251

## 2019-01-23 SURGERY — LOWER EXTREMITY ANGIOGRAPHY
Anesthesia: Moderate Sedation | Laterality: Left

## 2019-01-23 MED ORDER — HYDROMORPHONE HCL 1 MG/ML IJ SOLN: 1.0000 mg | Freq: Once | INTRAMUSCULAR | Status: DC | PRN

## 2019-01-23 MED ORDER — MIDAZOLAM HCL 2 MG/ML PO SYRP: 8.0000 mg | ORAL_SOLUTION | Freq: Once | ORAL | Status: DC | PRN

## 2019-01-23 MED ORDER — SODIUM CHLORIDE 0.9% FLUSH: 3.0000 mL | INTRAVENOUS | Status: DC | PRN

## 2019-01-23 MED ORDER — ACETAMINOPHEN 325 MG PO TABS: 650.0000 mg | ORAL_TABLET | ORAL | Status: DC | PRN

## 2019-01-23 MED ORDER — MIDAZOLAM HCL 5 MG/5ML IJ SOLN
INTRAMUSCULAR | Status: AC
  Filled 2019-01-23: qty 10

## 2019-01-23 MED ORDER — HEPARIN (PORCINE) IN NACL 1000-0.9 UT/500ML-% IV SOLN
INTRAVENOUS | Status: AC
  Filled 2019-01-23: qty 1000

## 2019-01-23 MED ORDER — MORPHINE SULFATE (PF) 4 MG/ML IV SOLN: 2.0000 mg | INTRAVENOUS | Status: DC | PRN

## 2019-01-23 MED ORDER — FENTANYL CITRATE (PF) 100 MCG/2ML IJ SOLN
INTRAMUSCULAR | Status: AC
  Filled 2019-01-23: qty 4

## 2019-01-23 MED ORDER — IOPAMIDOL (ISOVUE-300) INJECTION 61%
INTRAVENOUS | Status: DC | PRN
  Administered 2019-01-23: 120 mL via INTRAVENOUS

## 2019-01-23 MED ORDER — FENTANYL CITRATE (PF) 100 MCG/2ML IJ SOLN
INTRAMUSCULAR | Status: DC | PRN
  Administered 2019-01-23: 50 ug via INTRAVENOUS
  Administered 2019-01-23 (×2): 25 ug via INTRAVENOUS
  Administered 2019-01-23: 50 ug via INTRAVENOUS
  Administered 2019-01-23 (×2): 25 ug via INTRAVENOUS

## 2019-01-23 MED ORDER — ONDANSETRON HCL 4 MG/2ML IJ SOLN: 4.0000 mg | Freq: Four times a day (QID) | INTRAMUSCULAR | Status: DC | PRN

## 2019-01-23 MED ORDER — HEPARIN SODIUM (PORCINE) 1000 UNIT/ML IJ SOLN
INTRAMUSCULAR | Status: AC
  Filled 2019-01-23: qty 1

## 2019-01-23 MED ORDER — METHYLPREDNISOLONE SODIUM SUCC 125 MG IJ SOLR: 125.0000 mg | Freq: Once | INTRAMUSCULAR | Status: DC | PRN

## 2019-01-23 MED ORDER — HEPARIN (PORCINE) IN NACL 1000-0.9 UT/500ML-% IV SOLN
INTRAVENOUS | Status: AC
  Filled 2019-01-23: qty 500

## 2019-01-23 MED ORDER — OXYCODONE HCL 5 MG PO TABS: 5.0000 mg | ORAL_TABLET | ORAL | Status: DC | PRN

## 2019-01-23 MED ORDER — HEPARIN SODIUM (PORCINE) 1000 UNIT/ML IJ SOLN
INTRAMUSCULAR | Status: DC | PRN
  Administered 2019-01-23: 5000 [IU] via INTRAVENOUS

## 2019-01-23 MED ORDER — SODIUM CHLORIDE 0.9 % IV SOLN: 250.0000 mL | INTRAVENOUS | Status: DC | PRN

## 2019-01-23 MED ORDER — FAMOTIDINE 20 MG PO TABS: 40.0000 mg | ORAL_TABLET | Freq: Once | ORAL | Status: DC | PRN

## 2019-01-23 MED ORDER — SODIUM CHLORIDE 0.9 % IV SOLN
INTRAVENOUS | Status: DC
  Administered 2019-01-23: 1000 mL via INTRAVENOUS

## 2019-01-23 MED ORDER — HYDRALAZINE HCL 20 MG/ML IJ SOLN: 5.0000 mg | INTRAMUSCULAR | Status: DC | PRN

## 2019-01-23 MED ORDER — MIDAZOLAM HCL 2 MG/2ML IJ SOLN
INTRAMUSCULAR | Status: DC | PRN
  Administered 2019-01-23: 2 mg via INTRAVENOUS
  Administered 2019-01-23 (×5): 1 mg via INTRAVENOUS

## 2019-01-23 MED ORDER — SODIUM CHLORIDE 0.9% FLUSH: 3.0000 mL | Freq: Two times a day (BID) | INTRAVENOUS | Status: DC

## 2019-01-23 MED ORDER — SODIUM CHLORIDE 0.9 % IV SOLN: INTRAVENOUS | Status: DC

## 2019-01-23 MED ORDER — DIPHENHYDRAMINE HCL 50 MG/ML IJ SOLN: 50.0000 mg | Freq: Once | INTRAMUSCULAR | Status: DC | PRN

## 2019-01-23 MED ORDER — LABETALOL HCL 5 MG/ML IV SOLN: 10.0000 mg | INTRAVENOUS | Status: DC | PRN

## 2019-01-23 SURGICAL SUPPLY — 31 items
BALLN LUTONIX AV 8X40X75 (BALLOONS) ×3
BALLN LUTONIX AV 8X60X75 (BALLOONS) ×3
BALLN ULTRASCORE 4X100X130 (BALLOONS) ×3
BALLOON LUTONIX AV 8X40X75 (BALLOONS) ×1 IMPLANT
BALLOON LUTONIX AV 8X60X75 (BALLOONS) ×1 IMPLANT
BALLOON ULTRASCORE 4X100X130 (BALLOONS) ×1 IMPLANT
CATH BEACON 5 .035 40 KMP TP (CATHETERS) ×1 IMPLANT
CATH BEACON 5 .038 40 KMP TP (CATHETERS) ×2
CATH CROSSER 14S OTW 146CM (CATHETERS) ×3 IMPLANT
CATH PIG 70CM (CATHETERS) ×3 IMPLANT
CATH SIDEKICK XL ST 110CM (SHEATH) ×3 IMPLANT
CATH VERT 5FR 125CM (CATHETERS) ×3 IMPLANT
COVER PROBE U/S 5X48 (MISCELLANEOUS) ×3 IMPLANT
DEVICE PRESTO INFLATION (MISCELLANEOUS) ×3 IMPLANT
DEVICE STARCLOSE SE CLOSURE (Vascular Products) ×3 IMPLANT
DEVICE TORQUE .025-.038 (MISCELLANEOUS) ×3 IMPLANT
GLIDEWIRE ADV .035X260CM (WIRE) ×3 IMPLANT
KIT FLOWMATE PROCEDURAL (KITS) ×3 IMPLANT
NEEDLE ENTRY 21GA 7CM ECHOTIP (NEEDLE) ×3 IMPLANT
PACK ANGIOGRAPHY (CUSTOM PROCEDURE TRAY) ×3 IMPLANT
SET INTRO CAPELLA COAXIAL (SET/KITS/TRAYS/PACK) ×3 IMPLANT
SHEATH BRITE TIP 5FRX11 (SHEATH) ×3 IMPLANT
SHEATH BRITE TIP 7FRX11 (SHEATH) ×3 IMPLANT
SHEATH FLEXOR ANSEL2 7FRX45 (SHEATH) ×3 IMPLANT
STENT LIFESTAR 9X40 (Permanent Stent) ×3 IMPLANT
STENT LIFESTREAM 7X37X80 (Permanent Stent) ×3 IMPLANT
SYR MEDRAD MARK 7 150ML (SYRINGE) ×3 IMPLANT
TUBING CONTRAST HIGH PRESS 72 (TUBING) ×3 IMPLANT
WIRE AQUATRACK .035X260CM (WIRE) ×3 IMPLANT
WIRE J 3MM .035X145CM (WIRE) ×3 IMPLANT
WIRE MAGIC TORQUE 260C (WIRE) ×3 IMPLANT

## 2019-01-23 NOTE — H&P (Signed)
[facility] History & Physical Update  The patient was interviewed and re-examined.  The patient's previous History and Physical has been reviewed and is unchanged.  There is no change in the plan of care. We plan to proceed with the scheduled procedure.  Levora Dredge, MD  01/23/2019, 9:31 AM

## 2019-01-23 NOTE — Op Note (Signed)
[facility] Percutaneous Study/Intervention Procedural Note   Date of Surgery: 01/23/2019  Surgeon:  Katha Cabal, MD.  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 Introduction catheter into left lower extremity 3rd order catheter placement  2. Contrast injection left lower extremity for distal runoff   3.     Percutaneous transluminal angioplasty and stent placement right external iliac artery              4.   Percutaneous transluminal angioplasty and stent placement left external iliac artery              5.      Crosser atherectomy with percutaneous transluminal angioplasty left superficial femoral artery unsuccessful 4. Star close closure right common femoral arteriotomy  Anesthesia: Conscious sedation was administered under my direct supervision by the interventional radiology RN. IV Versed plus fentanyl were utilized. Continuous ECG, pulse oximetry and blood pressure was monitored throughout the entire procedure.  Conscious sedation was for a total of 86 minutes.  Sheath: 7 [place] Ansell right common femoral retrograde  Contrast: 120 cc  Fluoroscopy Time: 29.6 minutes  Indications: Gavin Mendez presents with ischemic rest pain of the left lower extremity.  His physical examination as well as noninvasive studies support severe atherosclerotic occlusive disease of the left lower extremity.  The risks and benefits are reviewed all questions answered patient agrees to proceed.  Procedure: Gavin Mendez is a 63 y.o. y.o. male who was identified and appropriate procedural time out was performed. The patient was then placed supine on the table and prepped and draped in the usual sterile fashion.   Ultrasound was placed in the sterile sleeve and the right  groin was evaluated the right common femoral artery was echolucent and pulsatile indicating patency.  Image was recorded for the permanent record and under real-time visualization a microneedle was inserted into the common femoral artery microwire followed by a micro-sheath.  A J-wire was then advanced through the micro-sheath and a  5 [place] sheath was then inserted over a J-wire.   J-wire would not advance under fluoroscopy so hand-injection of contrast was performed in an LAO projection through the 5 [place] sheath.  This demonstrated a greater than 90% stenosis in the proximal one third of the right external iliac artery.  There is a greater than 90% stenosis at the origin of the right internal iliac artery as well.  Given this finding treatment of his left lower extremity could not be undertaken until this was corrected.  Therefore 5000 units of heparin was given and allowed to circulate for several minutes.  Using a Glidewire and a Kumpe catheter the stenosis was negotiated KMP catheter was used to verify intraluminal placement in the distal aorta via hand-injection.  Magic torque wire was then inserted.  A 7 mm x 37 mm lifestream stent was then advanced over the wire position across the lesion and deployed to 12 atm.  Follow-up imaging demonstrated the stent was slightly undersized and a 8 mm x 60 mm Lutonix drug-eluting balloon was advanced across the stent inflated to 8 atm for 1 minute.  Follow-up imaging now demonstrated essentially 0 residual stenosis with wide patency.  The pigtail catheter was then advanced over the Magic torque wire and positioned at the level of T12.  AP projection of the aorta was then obtained. Pigtail catheter was repositioned to above the bifurcation and  a RAO view of the pelvis was obtained.  Subsequently a pigtail catheter with the stiff angle Glidewire was used to cross the aortic bifurcation the catheter wire were advanced down into the left distal external iliac artery.   Glidewire was exchanged for a Magic torque wire and then a 7 [place] Ansell sheath was advanced up and over the bifurcation position with the tip in the mid common iliac.  Oblique view had demonstrated a greater than 80% stenosis at the origin of the left external iliac artery and again treatment was indicated both to relieve his ischemic symptoms as well as to allow for passage of the sheath and treatment more distally.  A 9 mm x 40 mm lifestream stent was then deployed across the external iliac stenosis and postdilated with an 8 x 40 Lutonix drug-eluting balloon.  Inflation was to 10 atm for 1 minute.  Follow-up imaging demonstrated less than 5% residual stenosis.  The dilator was then reintroduced into the [facility] sheath and the Ansell sheath advanced down to the left common femoral.  This was done under fluoroscopy to ensure that there was no deformation of the stent.  Oblique view of the femoral bifurcation was then obtained and subsequently the wire was reintroduced and the pigtail catheter negotiated into the SFA representing third order catheter placement. Distal runoff was then performed however imaging below the knee was completely inadequate.  The SFA appears to occlude at [facility] canal and remains occluded to the at knee popliteal.  The trifurcation appears to be heavily diseased and I cannot determine what tibial runoff is below the level of the trifurcation.  A Kumpe catheter and wire were negotiated down to the occlusion of the SFA the Kumpe was removed and a straight side kick catheter advanced.  The 68 S crosser was then delivered and used to cross the occlusion.  Unfortunately true lumen could not be reentered.  Angioplasty with an ultra score balloon was also attempted but again reentry the true lumen could not be achieved.  I elected to terminate the case at this point and will discuss pedal access with the patient.  After review of these images the sheath is pulled into the right  external iliac oblique of the common femoral is obtained and a Star close device deployed. There no immediate complications.   Findings: The abdominal aorta is opacified with a bolus injection contrast. Renal arteries are single and widely patent. The aorta itself has diffuse disease but no hemodynamically significant lesions. The right and left common iliac arteries are widely patent bilaterally, there is a 30% focal stenosis in the right but I do not believe this is hemodynamically significant.  The right external iliac artery demonstrates a greater than 90% stenosis in its proximal one third.  The left external iliac artery demonstrates a greater than 80% stenosis in its proximal one third.  The origins of the internal iliac arteries are string signs bilaterally  The left common femoral is widely patent as is the profunda femoris.  The SFA occludes at [place]'s canal and remains occluded through the at knee popliteal.  Distally there is very poor imaging in spite of full-strength contrast the trifurcation appears to be heavily diseased there does appear to be a posterior tibial runoff to the distal calf.  Following crosser atherectomy and angioplasty the left SFA popliteal remains occluded as true lumen reentry could not be achieved.  There is successful angioplasty and stent placement of both iliac arteries with less than 5% residual  stenosis.     Summary: Inflow has been successfully reestablished I will bring the patient back to the office to discuss combination of retrograde tibial access and antegrade access to recanalize the SFA.   Disposition: Patient was taken to the recovery room in stable condition having tolerated the procedure well.  Diavion Labrador, Dolores Lory 01/23/2019,11:51 AM

## 2019-01-26 ENCOUNTER — Encounter (INDEPENDENT_AMBULATORY_CARE_PROVIDER_SITE_OTHER): Payer: Self-pay | Admitting: Vascular Surgery

## 2019-01-26 ENCOUNTER — Ambulatory Visit (INDEPENDENT_AMBULATORY_CARE_PROVIDER_SITE_OTHER): Payer: BLUE CROSS/BLUE SHIELD | Admitting: Vascular Surgery

## 2019-01-26 ENCOUNTER — Telehealth (INDEPENDENT_AMBULATORY_CARE_PROVIDER_SITE_OTHER): Payer: Self-pay

## 2019-01-26 ENCOUNTER — Encounter (INDEPENDENT_AMBULATORY_CARE_PROVIDER_SITE_OTHER): Payer: Self-pay

## 2019-01-26 VITALS — BP 153/89 | HR 89 | Resp 16 | Ht 69.0 in | Wt 173.6 lb

## 2019-01-26 DIAGNOSIS — E782 Mixed hyperlipidemia: Secondary | ICD-10-CM

## 2019-01-26 DIAGNOSIS — Z9862 Peripheral vascular angioplasty status: Secondary | ICD-10-CM

## 2019-01-26 DIAGNOSIS — I25118 Atherosclerotic heart disease of native coronary artery with other forms of angina pectoris: Secondary | ICD-10-CM | POA: Diagnosis not present

## 2019-01-26 DIAGNOSIS — I10 Essential (primary) hypertension: Secondary | ICD-10-CM

## 2019-01-26 DIAGNOSIS — I70222 Atherosclerosis of native arteries of extremities with rest pain, left leg: Secondary | ICD-10-CM | POA: Diagnosis not present

## 2019-01-26 DIAGNOSIS — F17210 Nicotine dependence, cigarettes, uncomplicated: Secondary | ICD-10-CM

## 2019-01-26 DIAGNOSIS — Z79899 Other long term (current) drug therapy: Secondary | ICD-10-CM

## 2019-01-26 NOTE — Progress Notes (Signed)
MRN : [id number]  Gavin Mendez is a 63 y.o. ([birth year]) male who presents with chief complaint of No chief complaint on file. Marland Kitchen  History of Present Illness:   The patient returns to the office for followup and review status post left leg angiogram on 01/23/2019 with successful iliac intervention but unsuccessful recanalization of the SFA.    The patient notes no improvement in the lower extremity symptoms. He continues to have rest pain and has not been sleeping well.  During the day he rates the pain as a 3/10.  No new ulcers or wounds have occurred since the last visit.  There have been no significant changes to the patient's overall health care.  The patient denies amaurosis fugax or recent TIA symptoms. There are no recent neurological changes noted. The patient denies history of DVT, PE or superficial thrombophlebitis. The patient denies recent episodes of angina or shortness of breath.     No outpatient medications have been marked as taking for the 01/26/19 encounter (Appointment) with Gilda Crease, Latina Craver, MD.    Past Medical History:  Diagnosis Date  . Anemia 2005   was on b12 shots briefly and supplements  . Arthritis   . Cardiovascular disease   . High blood pressure   . Myocardial infarction (HCC) 2015   1 stent  . Peripheral vascular disease [facility])     Past Surgical History:  Procedure Laterality Date  . CORONARY ANGIOPLASTY WITH STENT PLACEMENT  2015   1 stent  . LOWER EXTREMITY ANGIOGRAPHY Left 01/23/2019   Procedure: LOWER EXTREMITY ANGIOGRAPHY;  Surgeon: Renford Dills, MD;  Location: [facility] INVASIVE CV LAB;  Service: Cardiovascular;  Laterality: Left;    Social History Social History   Tobacco Use  . Smoking status: Current Every Day Smoker    Packs/day: 1.00    Years: 45.00    Pack years: 45.00    Types: Cigarettes  . Smokeless tobacco: Never Used  Substance Use Topics  . Alcohol use: Never    Frequency: Never  . Drug use: Never    Family  History Family History  Problem Relation Age of Onset  . Diabetes Mother   . Obesity Mother   . Heart attack Father   . Stroke Father     Allergies  Allergen Reactions  . Influenza A (H1n1) Monovalent Vaccine Cough    Cough, fever  20 yrs ago  . Lanolin Rash    rash     REVIEW OF SYSTEMS (Negative unless checked)  Constitutional: [] Weight loss  [] Fever  [] Chills Cardiac: [] Chest pain   [] Chest pressure   [] Palpitations   [] Shortness of breath when laying flat   [] Shortness of breath with exertion. Vascular:  [x] Pain in legs with walking   [x] Pain in legs at rest  [] History of DVT   [] Phlebitis   [] Swelling in legs   [] Varicose veins   [] Non-healing ulcers Pulmonary:   [] Uses home oxygen   [] Productive cough   [] Hemoptysis   [] Wheeze  [] COPD   [] Asthma Neurologic:  [] Dizziness   [] Seizures   [] History of stroke   [] History of TIA  [] Aphasia   [] Vissual changes   [] Weakness or numbness in arm   [] Weakness or numbness in leg Musculoskeletal:   [] Joint swelling   [] Joint pain   [] Low back pain Hematologic:  [] Easy bruising  [] Easy bleeding   [] Hypercoagulable state   [] Anemic Gastrointestinal:  [] Diarrhea   [] Vomiting  [] Gastroesophageal reflux/heartburn   [] Difficulty swallowing. Genitourinary:  [] Chronic kidney  disease   [] Difficult urination  [] Frequent urination   [] Blood in urine Skin:  [] Rashes   [] Ulcers  Psychological:  [] History of anxiety   []  History of major depression.  Physical Examination  There were no vitals filed for this visit. There is no height or weight on file to calculate BMI. Gen: WD/WN, NAD Head: [place]/AT, No temporalis wasting.  Ear/Nose/Throat: Hearing grossly intact, nares w/o erythema or drainage Eyes: PER, EOMI, sclera nonicteric.  Neck: Supple, no large masses.   Pulmonary:  Good air movement, no audible wheezing bilaterally, no use of accessory muscles.  Cardiac: RRR, no JVD Vascular:  Vessel Right Left  Radial Palpable Palpable    Gastrointestinal: Non-distended. No guarding/no peritoneal signs.  Musculoskeletal: M/S 5/5 throughout.  No deformity or atrophy.  Neurologic: CN 2-12 intact. Symmetrical.  Speech is fluent. Motor exam as listed above. Psychiatric: Judgment intact, Mood & affect appropriate for pt's clinical situation. Dermatologic: No rashes or ulcers noted.  No changes consistent with cellulitis. Lymph : No lichenification or skin changes of chronic lymphedema.  CBC Lab Results  Component Value Date   WBC 12.0 (H) 05/20/2014   HGB 13.9 05/20/2014   HCT 41.4 05/20/2014   MCV 96 05/20/2014   PLT 213 05/20/2014    BMET    Component Value Date/Time   NA 137 05/21/2014 0405   K 3.7 05/21/2014 0405   CL 105 05/21/2014 0405   CO2 22 05/21/2014 0405   GLUCOSE 106 (H) 05/21/2014 0405   BUN 15 01/22/2019 1358   BUN 7 05/21/2014 0405   CREATININE 0.67 01/22/2019 1358   CREATININE 0.68 05/21/2014 0405   CALCIUM 8.7 05/21/2014 0405   GFRNONAA >60 01/22/2019 1358   GFRNONAA >60 05/21/2014 0405   GFRAA >60 01/22/2019 1358   GFRAA >60 05/21/2014 0405   Estimated Creatinine Clearance: 92.1 mL/min (by C-G formula based on SCr of 0.67 mg/dL).  COAG No results found for: INR, PROTIME  Radiology Vas [place] Vanice Sarah With/wo Tbi  Result Date: 01/19/2019 LOWER EXTREMITY DOPPLER STUDY Indications: Claudication, and rest pain.  Performing Technologist: Reece Agar RT (R)(VS)  Examination Guidelines: A complete evaluation includes at minimum, Doppler waveform signals and systolic blood pressure reading at the level of bilateral brachial, anterior tibial, and posterior tibial arteries, when vessel segments are accessible. Bilateral testing is considered an integral part of a complete examination. Photoelectric Plethysmograph (PPG) waveforms and toe systolic pressure readings are included as required and additional duplex testing as needed. Limited examinations for reoccurring indications may be performed as noted.  ABI  Findings: +---------+------------------+-----+---------+--------+ Right    Rt Pressure (mmHg)IndexWaveform Comment  +---------+------------------+-----+---------+--------+ Brachial 154                                      +---------+------------------+-----+---------+--------+ ATA      169               1.01 triphasic         +---------+------------------+-----+---------+--------+ PTA      173               1.03 triphasic         +---------+------------------+-----+---------+--------+ Maryann Conners               0.76 Normal            +---------+------------------+-----+---------+--------+ +---------+------------------+-----+----------+-------+ Left     Lt Pressure (mmHg)IndexWaveform  Comment +---------+------------------+-----+----------+-------+ Brachial 168                                      +---------+------------------+-----+----------+-------+  ATA      61                0.36 monophasic        +---------+------------------+-----+----------+-------+ PTA      59                0.35 monophasic        +---------+------------------+-----+----------+-------+ Great Toe30                0.18 Abnormal          +---------+------------------+-----+----------+-------+ +-------+-----------+-----------+------------+------------+ ABI/TBIToday's ABIToday's TBIPrevious ABIPrevious TBI +-------+-----------+-----------+------------+------------+ Right  1.03       .76                                 +-------+-----------+-----------+------------+------------+ Left   .36        .18                                 +-------+-----------+-----------+------------+------------+ Summary: Right: Resting right ankle-brachial index is within normal range. No evidence of significant right lower extremity arterial disease. The right toe-brachial index is normal. Left: Resting left ankle-brachial index indicates severe left lower extremity arterial disease. The left  toe-brachial index is abnormal. *See table(s) above for measurements and observations.  Electronically signed by Levora Dredge MD on 01/19/2019 at 4:52:07 PM.   Final      Assessment/Plan 1. Atherosclerosis of native artery of left lower extremity with rest pain (HCC) Recommend:  The patient has evidence of severe atherosclerotic changes of both lower extremities with left leg rest pain that is associated with preulcerative changes and impending tissue loss of the foot.  This represents a left limb threatening ischemia and places the patient at the risk for limb loss.  Patient should undergo angiography of the leg lower extremities with the hope for intervention for limb salvage.  Pedal access will be utilized  The risks and benefits as well as the alternative therapies was discussed in detail with the patient.  All questions were answered.  Patient agrees to proceed with angiography.  The patient will follow up with me in the office after the procedure.     2. Essential hypertension Continue antihypertensive medications as already ordered, these medications have been reviewed and there are no changes at this time.   3. Coronary artery disease of native artery of native heart with stable angina pectoris (HCC) Continue cardiac and antihypertensive medications as already ordered and reviewed, no changes at this time.  Continue statin as ordered and reviewed, no changes at this time  Nitrates PRN for chest pain   4. Mixed hyperlipidemia Continue statin as ordered and reviewed, no changes at this time     Levora Dredge, MD  01/26/2019 8:31 AM

## 2019-01-26 NOTE — Telephone Encounter (Signed)
Patient was seen today and scheduled for a left leg angio with Dr. Gilda Crease on 01/28/2019 with a 8:00 am arrival time. This information was given to the patient.

## 2019-01-27 ENCOUNTER — Other Ambulatory Visit (INDEPENDENT_AMBULATORY_CARE_PROVIDER_SITE_OTHER): Payer: Self-pay | Admitting: Nurse Practitioner

## 2019-01-28 ENCOUNTER — Encounter
Admission: RE | Disposition: A | Discharge status: Home / Self Care | Payer: Self-pay | Source: Home / Self Care | Attending: Vascular Surgery

## 2019-01-28 ENCOUNTER — Other Ambulatory Visit: Payer: Self-pay

## 2019-01-28 ENCOUNTER — Ambulatory Visit
Admission: RE | Admit: 2019-01-28 | Discharge: 2019-01-28 | Disposition: A | Discharge status: Home / Self Care | Payer: BLUE CROSS/BLUE SHIELD | Attending: Vascular Surgery | Admitting: Vascular Surgery

## 2019-01-28 DIAGNOSIS — I70222 Atherosclerosis of native arteries of extremities with rest pain, left leg: Secondary | ICD-10-CM | POA: Insufficient documentation

## 2019-01-28 DIAGNOSIS — I10 Essential (primary) hypertension: Secondary | ICD-10-CM | POA: Diagnosis not present

## 2019-01-28 DIAGNOSIS — I252 Old myocardial infarction: Secondary | ICD-10-CM | POA: Diagnosis not present

## 2019-01-28 DIAGNOSIS — F17210 Nicotine dependence, cigarettes, uncomplicated: Secondary | ICD-10-CM | POA: Diagnosis not present

## 2019-01-28 DIAGNOSIS — I25118 Atherosclerotic heart disease of native coronary artery with other forms of angina pectoris: Secondary | ICD-10-CM | POA: Insufficient documentation

## 2019-01-28 DIAGNOSIS — Z8249 Family history of ischemic heart disease and other diseases of the circulatory system: Secondary | ICD-10-CM | POA: Insufficient documentation

## 2019-01-28 DIAGNOSIS — M1990 Unspecified osteoarthritis, unspecified site: Secondary | ICD-10-CM | POA: Insufficient documentation

## 2019-01-28 DIAGNOSIS — Z887 Allergy status to serum and vaccine status: Secondary | ICD-10-CM | POA: Insufficient documentation

## 2019-01-28 DIAGNOSIS — E782 Mixed hyperlipidemia: Secondary | ICD-10-CM | POA: Diagnosis not present

## 2019-01-28 DIAGNOSIS — Z823 Family history of stroke: Secondary | ICD-10-CM | POA: Insufficient documentation

## 2019-01-28 DIAGNOSIS — Z955 Presence of coronary angioplasty implant and graft: Secondary | ICD-10-CM | POA: Diagnosis not present

## 2019-01-28 DIAGNOSIS — I70229 Atherosclerosis of native arteries of extremities with rest pain, unspecified extremity: Secondary | ICD-10-CM

## 2019-01-28 HISTORY — PX: LOWER EXTREMITY ANGIOGRAPHY: CATH118251

## 2019-01-28 LAB — POCT ACTIVATED CLOTTING TIME: Activated Clotting Time: 197 seconds

## 2019-01-28 SURGERY — LOWER EXTREMITY ANGIOGRAPHY
Anesthesia: Moderate Sedation | Site: Leg Lower | Laterality: Left

## 2019-01-28 MED ORDER — CEFAZOLIN SODIUM-DEXTROSE 2-4 GM/100ML-% IV SOLN
2.0000 g | Freq: Once | INTRAVENOUS | Status: AC
  Administered 2019-01-28: 2 g via INTRAVENOUS

## 2019-01-28 MED ORDER — HEPARIN SODIUM (PORCINE) 1000 UNIT/ML IJ SOLN
INTRAMUSCULAR | Status: AC
  Filled 2019-01-28: qty 1

## 2019-01-28 MED ORDER — HYDROCODONE-ACETAMINOPHEN 5-325 MG PO TABS: 1.0000 | ORAL_TABLET | Freq: Four times a day (QID) | ORAL | 0 refills | Status: AC | PRN

## 2019-01-28 MED ORDER — FAMOTIDINE 20 MG PO TABS: 40.0000 mg | ORAL_TABLET | Freq: Once | ORAL | Status: DC | PRN

## 2019-01-28 MED ORDER — HEPARIN SODIUM (PORCINE) 1000 UNIT/ML IJ SOLN
INTRAMUSCULAR | Status: DC | PRN
  Administered 2019-01-28: 5000 [IU] via INTRAVENOUS

## 2019-01-28 MED ORDER — MIDAZOLAM HCL 2 MG/2ML IJ SOLN
INTRAMUSCULAR | Status: DC | PRN
  Administered 2019-01-28: 1 mg via INTRAVENOUS
  Administered 2019-01-28: 2 mg via INTRAVENOUS
  Administered 2019-01-28 (×2): 0.5 mg via INTRAVENOUS
  Administered 2019-01-28: 1 mg via INTRAVENOUS

## 2019-01-28 MED ORDER — METHYLPREDNISOLONE SODIUM SUCC 125 MG IJ SOLR: 125.0000 mg | Freq: Once | INTRAMUSCULAR | Status: DC | PRN

## 2019-01-28 MED ORDER — DIPHENHYDRAMINE HCL 50 MG/ML IJ SOLN: 50.0000 mg | Freq: Once | INTRAMUSCULAR | Status: DC | PRN

## 2019-01-28 MED ORDER — FENTANYL CITRATE (PF) 100 MCG/2ML IJ SOLN
INTRAMUSCULAR | Status: AC
  Filled 2019-01-28: qty 2

## 2019-01-28 MED ORDER — HYDROMORPHONE HCL 1 MG/ML IJ SOLN: 1.0000 mg | Freq: Once | INTRAMUSCULAR | Status: DC | PRN

## 2019-01-28 MED ORDER — MIDAZOLAM HCL 5 MG/5ML IJ SOLN
INTRAMUSCULAR | Status: AC
  Filled 2019-01-28: qty 5

## 2019-01-28 MED ORDER — FENTANYL CITRATE (PF) 100 MCG/2ML IJ SOLN
INTRAMUSCULAR | Status: DC | PRN
  Administered 2019-01-28 (×2): 25 ug via INTRAVENOUS
  Administered 2019-01-28 (×3): 50 ug via INTRAVENOUS

## 2019-01-28 MED ORDER — IOHEXOL 300 MG/ML  SOLN
INTRAMUSCULAR | Status: DC | PRN
  Administered 2019-01-28: 75 mL via INTRAVENOUS

## 2019-01-28 MED ORDER — LIDOCAINE HCL (PF) 1 % IJ SOLN
INTRAMUSCULAR | Status: AC
  Filled 2019-01-28: qty 30

## 2019-01-28 MED ORDER — MIDAZOLAM HCL 2 MG/ML PO SYRP: 8.0000 mg | ORAL_SOLUTION | Freq: Once | ORAL | Status: DC | PRN

## 2019-01-28 MED ORDER — ONDANSETRON HCL 4 MG/2ML IJ SOLN: 4.0000 mg | Freq: Four times a day (QID) | INTRAMUSCULAR | Status: DC | PRN

## 2019-01-28 MED ORDER — SODIUM CHLORIDE 0.9 % IV SOLN: INTRAVENOUS | Status: DC

## 2019-01-28 SURGICAL SUPPLY — 34 items
BALLN LUTONIX 018 4X150X130 (BALLOONS) ×3
BALLN LUTONIX 018 5X150X130 (BALLOONS) ×3
BALLN LUTONIX 018 5X60X130 (BALLOONS) ×3
BALLN LUTONIX 018 6X100X130 (BALLOONS) ×3
BALLN ULTRVRSE 3X100X150 (BALLOONS) ×3
BALLOON LUTONIX 018 4X150X130 (BALLOONS) IMPLANT
BALLOON LUTONIX 018 5X150X130 (BALLOONS) IMPLANT
BALLOON LUTONIX 018 5X60X130 (BALLOONS) IMPLANT
BALLOON LUTONIX 018 6X100X130 (BALLOONS) IMPLANT
BALLOON ULTRVRSE 3X100X150 (BALLOONS) IMPLANT
CATH ANGIO 5F 100CM .035 PIG (CATHETERS) ×2 IMPLANT
CATH BEACON 5 .035 40 KMP TP (CATHETERS) IMPLANT
CATH BEACON 5 .035 65 KMP TIP (CATHETERS) ×2 IMPLANT
CATH BEACON 5 .035 65 RIM TIP (CATHETERS) ×2 IMPLANT
CATH BEACON 5 .038 40 KMP TP (CATHETERS) ×2
DEVICE PRESTO INFLATION (MISCELLANEOUS) ×2 IMPLANT
DEVICE STARCLOSE SE CLOSURE (Vascular Products) ×2 IMPLANT
DEVICE TORQUE .014-.018 (MISCELLANEOUS) IMPLANT
DRAPE INCISE IOBAN 66X45 STRL (DRAPES) ×2 IMPLANT
DRAPE TABLE BACK 80X90 (DRAPES) ×2 IMPLANT
GLIDEWIRE ADV .035X180CM (WIRE) ×2 IMPLANT
NDL ENTRY 21GA 7CM ECHOTIP (NEEDLE) IMPLANT
NEEDLE ENTRY 21GA 7CM ECHOTIP (NEEDLE) ×3 IMPLANT
PACK ANGIOGRAPHY (CUSTOM PROCEDURE TRAY) ×3 IMPLANT
SET INTRO CAPELLA COAXIAL (SET/KITS/TRAYS/PACK) ×2 IMPLANT
SHEATH BRITE TIP 5FRX11 (SHEATH) ×2 IMPLANT
SHEATH GLIDE SLENDER 4/5FR (SHEATH) ×2 IMPLANT
STENT LIFESTENT 5F 5X60X135 (Permanent Stent) ×2 IMPLANT
STENT LIFESTENT 5F 6X100X135 (Permanent Stent) ×2 IMPLANT
STENT LIFESTENT 5F 6X150X135 (Permanent Stent) ×2 IMPLANT
TORQUE DEVICE .014-.018 (MISCELLANEOUS) ×3
TOWEL OR 17X26 4PK STRL BLUE (TOWEL DISPOSABLE) ×2 IMPLANT
WIRE G V18X300CM (WIRE) ×2 IMPLANT
WIRE MAGIC TORQUE 260C (WIRE) ×2 IMPLANT

## 2019-01-28 NOTE — H&P (Signed)
 [facility] History & Physical Update  The patient was interviewed and re-examined.  The patient's previous History and Physical has been reviewed and is unchanged.  There is no change in the plan of care. We plan to proceed with the scheduled procedure.  Levora Dredge, MD  01/28/2019, 12:15 PM

## 2019-01-28 NOTE — Op Note (Signed)
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  Introduction catheter into left lower extremity 3rd order catheter placement from right femoral approach 2. Contrast injection left lower extremity for distal runoff  3.  Ultrasound-guided access to the anterior tibial artery             4.   Percutaneous transluminal angioplasty and stent placement superficial femoral and popliteal arteries to 6 mm 4. Percutaneous transluminal angioplasty left anterior tibial  5. Star close closure right common femoral arteriotomy  Anesthesia: Conscious sedation was administered under my direct supervision by the interventional radiology RN. IV Versed plus fentanyl were utilized. Continuous ECG, pulse oximetry and blood pressure was monitored throughout the entire procedure.  Conscious sedation was for a total of 85 minutes.  Sheath: 5 French sheath right common femoral retrograde; 5 French slender sheath left anterior tibial retrograde  Contrast: 75 cc  Fluoroscopy Time: 14.3 minutes  Indications: Dimarco Minkin presents with increasing pain of the left lower extremity.  Recent attempts at crossing the occluded SFA in an antegrade approach were unsuccessful since this procedure he has had increased pain and this suggests the patient is having limb threatening ischemia. The risks and benefits are reviewed all questions answered patient agrees to proceed.  Procedure:Cote Maselli is a 63 y.o. y.o. male who was identified and appropriate procedural time out was performed. The patient was then placed supine on the table and prepped and draped in the usual sterile  fashion.   Ultrasound was placed in the sterile sleeve and the right groin was evaluated the right common femoral artery was echolucent and pulsatile indicating patency. Image was recorded for the permanent record and under real-time visualization a microneedle was inserted into the common femoral artery followed by the microwire and then the micro-sheath. A J-wire was then advanced through the micro-sheath and a 5 [place] sheath was then inserted over a J-wire.  Glidewire was then advanced and a 5 French rim catheter was positioned at the level of the aortic bifurcation.  An RAO view of the pelvis was obtained. Subsequently the catheter with the stiff angle Glidewire was used to cross the aortic bifurcation the catheter wire were advanced down into the SFA representing third order catheter placement. Distal runoff was then performed.   Ultrasound was returned to the field in a sterile sleeve.  The anterior tibial artery was identified by ultrasound.  1% lidocaine was infiltrated in the soft tissues.  A micropuncture needle was then used to access the anterior tibial artery.  Image was recorded for the permanent record.  Microwire followed by a micro-sheath was then inserted.  A 0.018 wire was then advanced in a retrograde fashion through the tibial and negotiated into first the popliteal and then advanced into the SFA femoral.  Throughout the procedure imaging was obtained from both the pigtail catheter advanced from the right groin as well as from retrograde injection through the left pedal sheath.  5000 units of heparin was then given and allowed to circulate.  The detector was then repositioned and the SFA and popliteal was reimaged this demonstrated multiple areas of occlusion associated with significant stenosis beginning at Hunter's canal and extending down to the distal popliteal.  After appropriate measurements are made a 6 mm x 150 mm life stent is advanced  from the pedal access and  positioned just above the leading edge of the stricture.  It is deployed without difficulty.  A 5 mm x 150 mm Lutonix balloon was used to angioplasty the stent in the superficial femoral. Inflations were to 12 atmospheres for 1 minute.  Next a 6 mm x 100 mm life stent is deployed overlapping the initial stent and extending down into the popliteal.  Here the disease seemed even more profound and I elected to use a 6 mm x 100 mm Lutonix drug-eluting balloon.  Again the inflation was to 12 atm for approximately 1 minute.  Last a 6 mm x 60 millimeters life stent was deployed across the distal popliteal and this was postdilated with a 5 mm x 60 mm Lutonix drug-eluting balloon.  Follow-up imaging now demonstrated wide patency of the SFA and popliteal with less than 5% residual stenosis.  I then elected to treat the anterior tibial.  Initial angiography demonstrated there was a greater than 80% stenosis in the proximal one third of the anterior tibial and at the origin there is greater than 70% stenosis diffusely over the first 2 to 3 cm.  A 3 mm x 100 mm Ultraverse balloon was advanced through the pedal sheath and inflated to 12 atm for 1 minute.  Follow-up imaging demonstrated patency with excellent result. Distal runoff was then reassessed and noted to be widely patent.   After review of these images the right groin sheath is pulled into the right external iliac oblique of the common femoral is obtained and a Star close device deployed.  The pedal sheath is removed and pressure is held and a butterfly pressure dressing is applied.  There no immediate Complications.  Findings:  Imaging of the distal aorta at the aortic bifurcation and the iliac system demonstrates they are widely patent.  Previously placed stents in the right and left iliac systems are well-positioned and widely patent.  The left common femoral and profunda femoris are widely patent.  The SFA again is noted to have profound occlusive disease  at the level of Hunter's canal extending into the popliteal.  There are multiple areas of occlusion also associated with high-grade stenoses along this segment.  The trifurcation remains diseased with a greater than 50% stenosis at the ostia of the posterior tibial and greater than 70% stenosis in the proximal 2-3 cm of the anterior tibial as well as an 80% lesion more distally in the proximal one third of the anterior tibial.  Following angioplasty and stent placement of the SFA and popliteal artery there is now wide patency with less than 5% residual stenosis.  Following angioplasty of the anterior tibial there is now wide patency with less than 5% residual stenosis.  There is preservation of distal runoff.  Summary: Successful recanalization left lower extremity for limb salvage   Disposition: Patient was taken to the recovery room in stable condition having tolerated the procedure well.  Belenda Cruise Naftuli Dalsanto 01/28/2019,7:10 PM

## 2019-01-28 NOTE — Progress Notes (Signed)
Per Schnier, MD request, butterfly bandage removed from left foot and gauze/tegaderm bandage placed, no oozing or bleeding noted. Patient and spouse provided with instructions and education. Prescription provided per MD. Patient and wife stated no further questions at this time. Patient was transported for discharge via wheelchair.

## 2019-01-29 ENCOUNTER — Encounter: Payer: Self-pay | Admitting: Vascular Surgery

## 2019-02-06 ENCOUNTER — Other Ambulatory Visit (INDEPENDENT_AMBULATORY_CARE_PROVIDER_SITE_OTHER): Payer: Self-pay | Admitting: Vascular Surgery

## 2019-02-06 DIAGNOSIS — Z95820 Peripheral vascular angioplasty status with implants and grafts: Secondary | ICD-10-CM

## 2019-02-09 ENCOUNTER — Encounter (INDEPENDENT_AMBULATORY_CARE_PROVIDER_SITE_OTHER): Payer: Self-pay | Admitting: Vascular Surgery

## 2019-02-09 ENCOUNTER — Ambulatory Visit (INDEPENDENT_AMBULATORY_CARE_PROVIDER_SITE_OTHER): Payer: BLUE CROSS/BLUE SHIELD | Admitting: Vascular Surgery

## 2019-02-09 ENCOUNTER — Telehealth (INDEPENDENT_AMBULATORY_CARE_PROVIDER_SITE_OTHER): Payer: Self-pay | Admitting: Vascular Surgery

## 2019-02-09 ENCOUNTER — Ambulatory Visit (INDEPENDENT_AMBULATORY_CARE_PROVIDER_SITE_OTHER): Payer: Medicare Other

## 2019-02-09 ENCOUNTER — Other Ambulatory Visit: Payer: Self-pay

## 2019-02-09 VITALS — BP 136/83 | HR 71 | Resp 10 | Ht 69.0 in | Wt 170.0 lb

## 2019-02-09 DIAGNOSIS — Z79899 Other long term (current) drug therapy: Secondary | ICD-10-CM

## 2019-02-09 DIAGNOSIS — Z95820 Peripheral vascular angioplasty status with implants and grafts: Secondary | ICD-10-CM

## 2019-02-09 DIAGNOSIS — I10 Essential (primary) hypertension: Secondary | ICD-10-CM

## 2019-02-09 DIAGNOSIS — E782 Mixed hyperlipidemia: Secondary | ICD-10-CM | POA: Diagnosis not present

## 2019-02-09 DIAGNOSIS — I70222 Atherosclerosis of native arteries of extremities with rest pain, left leg: Secondary | ICD-10-CM | POA: Diagnosis not present

## 2019-02-09 DIAGNOSIS — I25118 Atherosclerotic heart disease of native coronary artery with other forms of angina pectoris: Secondary | ICD-10-CM | POA: Diagnosis not present

## 2019-02-09 DIAGNOSIS — Z7902 Long term (current) use of antithrombotics/antiplatelets: Secondary | ICD-10-CM

## 2019-02-09 DIAGNOSIS — F17210 Nicotine dependence, cigarettes, uncomplicated: Secondary | ICD-10-CM

## 2019-02-09 MED ORDER — GABAPENTIN 300 MG PO CAPS: 300.0000 mg | ORAL_CAPSULE | Freq: Every day | ORAL | 3 refills | Status: DC

## 2019-02-09 NOTE — Progress Notes (Signed)
MRN : [id number]  Gavin Mendez is a 63 y.o. (Dec 16, 1955) male who presents with chief complaint of No chief complaint on file. Marland Kitchen  History of Present Illness:   The patient returns to the office for followup and review status post angiogram with intervention on 01/28/2019.   1.   Percutaneous transluminal angioplasty and stent placement left superficial femoral and popliteal arteries to 6 mm 2. Percutaneous transluminal angioplasty left anterior tibial  The patient notes improvement in the lower extremity walking but his left foot pain especially at night remains moderate to severe.   No new ulcers or wounds have occurred since the last visit.  He did well with travel and has not had any major setbacks secondary to his trip.  There have been no significant changes to the patient's overall health care.  The patient denies amaurosis fugax or recent TIA symptoms. There are no recent neurological changes noted. The patient denies history of DVT, PE or superficial thrombophlebitis. The patient denies recent episodes of angina or shortness of breath.   ABI's Rt=1.14 and Lt=1.06  (previous ABI's Rt=1.03 and Lt=0.36) Duplex US of the left lower extremity arterial system shows patent arterial system with a widely patent stent  No outpatient medications have been marked as taking for the 02/09/19 encounter (Appointment) with Gilda Crease, Latina Craver, MD.    Past Medical History:  Diagnosis Date   Anemia 2005   was on b12 shots briefly and supplements   Arthritis    Cardiovascular disease    High blood pressure    Myocardial infarction [facility]) 2015   1 stent   Peripheral vascular disease (HCC)     Past Surgical History:  Procedure Laterality Date   CORONARY ANGIOPLASTY WITH STENT PLACEMENT  2015   1 stent   LOWER EXTREMITY ANGIOGRAPHY Left 01/23/2019   Procedure: LOWER EXTREMITY ANGIOGRAPHY;  Surgeon: Renford Dills, MD;  Location: [facility] INVASIVE CV LAB;  Service: Cardiovascular;   Laterality: Left;   LOWER EXTREMITY ANGIOGRAPHY Left 01/28/2019   Procedure: LOWER EXTREMITY ANGIOGRAPHY;  Surgeon: Renford Dills, MD;  Location: [facility] INVASIVE CV LAB;  Service: Cardiovascular;  Laterality: Left;    Social History Social History   Tobacco Use   Smoking status: Current Every Day Smoker    Packs/day: 1.00    Years: 45.00    Pack years: 45.00    Types: Cigarettes   Smokeless tobacco: Never Used  Substance Use Topics   Alcohol use: Never    Frequency: Never   Drug use: Never    Family History Family History  Problem Relation Age of Onset   Diabetes Mother    Obesity Mother    Heart attack Father    Stroke Father     Allergies  Allergen Reactions   Influenza A (H1n1) Monovalent Vaccine Cough    Cough, fever  20 yrs ago   Lanolin Rash    rash     REVIEW OF SYSTEMS (Negative unless checked)  Constitutional: Weight loss  Fever  Chills Cardiac: Chest pain   Chest pressure   Palpitations   Shortness of breath when laying flat   Shortness of breath with exertion. Vascular:  Pain in legs with walking   Pain in legs at rest  History of DVT   Phlebitis   Swelling in legs   Varicose veins   Non-healing ulcers Pulmonary:   Uses home oxygen   Productive cough   Hemoptysis   Wheeze  COPD   Asthma Neurologic:    Dizziness   [] Seizures   [] History of stroke   [] History of TIA  [] Aphasia   [] Vissual changes   [] Weakness or numbness in arm   [] Weakness or numbness in leg Musculoskeletal:   [] Joint swelling   [] Joint pain   [] Low back pain Hematologic:  [] Easy bruising  [] Easy bleeding   [] Hypercoagulable state   [] Anemic Gastrointestinal:  [] Diarrhea   [] Vomiting  [] Gastroesophageal reflux/heartburn   [] Difficulty swallowing. Genitourinary:  [] Chronic kidney disease   [] Difficult urination  [] Frequent urination   [] Blood in urine Skin:  [] Rashes   [] Ulcers  Psychological:  [] History of anxiety   []  History of  major depression.  Physical Examination  There were no vitals filed for this visit. There is no height or weight on file to calculate BMI. Gen: WD/WN, NAD Head: [place]/AT, No temporalis wasting.  Ear/Nose/Throat: Hearing grossly intact, nares w/o erythema or drainage Eyes: PER, EOMI, sclera nonicteric.  Neck: Supple, no large masses.   Pulmonary:  Good air movement, no audible wheezing bilaterally, no use of accessory muscles.  Cardiac: RRR, no JVD Vascular:  Vessel Right Left  Radial Palpable Palpable  PT Palpable 2+ Palpable  DP Palpable Trace Palpable  Gastrointestinal: Non-distended. No guarding/no peritoneal signs.  Musculoskeletal: M/S 5/5 throughout.  No deformity or atrophy.  Neurologic: CN 2-12 intact. Symmetrical.  Speech is fluent. Motor exam as listed above. Psychiatric: Judgment intact, Mood & affect appropriate for pt's clinical situation. Dermatologic: No rashes or ulcers noted.  No changes consistent with cellulitis. Lymph : No lichenification or skin changes of chronic lymphedema.  CBC Lab Results  Component Value Date   WBC 12.0 (H) 05/20/2014   HGB 13.9 05/20/2014   HCT 41.4 05/20/2014   MCV 96 05/20/2014   PLT 213 05/20/2014    BMET    Component Value Date/Time   NA 137 05/21/2014 0405   K 3.7 05/21/2014 0405   CL 105 05/21/2014 0405   CO2 22 05/21/2014 0405   GLUCOSE 106 (H) 05/21/2014 0405   BUN 15 01/22/2019 1358   BUN 7 05/21/2014 0405   CREATININE 0.67 01/22/2019 1358   CREATININE 0.68 05/21/2014 0405   CALCIUM 8.7 05/21/2014 0405   GFRNONAA >60 01/22/2019 1358   GFRNONAA >60 05/21/2014 0405   GFRAA >60 01/22/2019 1358   GFRAA >60 05/21/2014 0405   Estimated Creatinine Clearance: 92.1 mL/min (by C-G formula based on SCr of 0.67 mg/dL).  COAG No results found for: INR, PROTIME  Radiology Vas [place] Vanice Sarah With/wo Tbi  Result Date: 01/19/2019 LOWER EXTREMITY DOPPLER STUDY Indications: Claudication, and rest pain.  Performing Technologist:  Reece Agar RT (R)(VS)  Examination Guidelines: A complete evaluation includes at minimum, Doppler waveform signals and systolic blood pressure reading at the level of bilateral brachial, anterior tibial, and posterior tibial arteries, when vessel segments are accessible. Bilateral testing is considered an integral part of a complete examination. Photoelectric Plethysmograph (PPG) waveforms and toe systolic pressure readings are included as required and additional duplex testing as needed. Limited examinations for reoccurring indications may be performed as noted.  ABI Findings: +---------+------------------+-----+---------+--------+  Right     Rt Pressure (mmHg) Index Waveform  Comment   +---------+------------------+-----+---------+--------+  Brachial  154                                          +---------+------------------+-----+---------+--------+  ATA       169  1.01  triphasic           +---------+------------------+-----+---------+--------+  PTA       173                1.03  triphasic           +---------+------------------+-----+---------+--------+  Great Toe 127                0.76  Normal              +---------+------------------+-----+---------+--------+ +---------+------------------+-----+----------+-------+  Left      Lt Pressure (mmHg) Index Waveform   Comment  +---------+------------------+-----+----------+-------+  Brachial  168                                          +---------+------------------+-----+----------+-------+  ATA       61                 0.36  monophasic          +---------+------------------+-----+----------+-------+  PTA       59                 0.35  monophasic          +---------+------------------+-----+----------+-------+  Great Toe 30                 0.18  Abnormal            +---------+------------------+-----+----------+-------+ +-------+-----------+-----------+------------+------------+  ABI/TBI Today's ABI Today's TBI Previous ABI Previous TBI   +-------+-----------+-----------+------------+------------+  Right   1.03        .76                                    +-------+-----------+-----------+------------+------------+  Left    .36         .18                                    +-------+-----------+-----------+------------+------------+ Summary: Right: Resting right ankle-brachial index is within normal range. No evidence of significant right lower extremity arterial disease. The right toe-brachial index is normal. Left: Resting left ankle-brachial index indicates severe left lower extremity arterial disease. The left toe-brachial index is abnormal. *See table(s) above for measurements and observations.  Electronically signed by Levora Dredge MD on 01/19/2019 at 4:52:07 PM.   Final      Assessment/Plan 1. Atherosclerosis of native artery of left lower extremity with rest pain [facility]) Recommend:  The patient is status post successful angiogram with intervention.  The patient reports that the claudication symptoms are much improved while leg pain is still present it appears to be more neuropathic.  I will prescribe Neurontin 300 mg po qHS.  No further invasive studies, angiography or surgery at this time.  His noninvasive studies are now normalized.   The patient should continue walking and begin a more formal exercise program.  The patient should continue antiplatelet therapy and aggressive treatment of the lipid abnormalities  Smoking cessation was again discussed  The patient should continue wearing graduated compression socks 10-15 mmHg strength to control the mild edema.  Patient should undergo noninvasive studies as ordered. The patient will follow up with me after the studies.   - VAS [place] ABI WITH/WO TBI; Future -  VAS [place] LOWER EXTREMITY ARTERIAL DUPLEX; Future  2. Coronary artery disease of native artery of native heart with stable angina pectoris (HCC) Continue cardiac and antihypertensive medications as already ordered and  reviewed, no changes at this time.  Continue statin as ordered and reviewed, no changes at this time  Nitrates PRN for chest pain   3. Essential hypertension Continue antihypertensive medications as already ordered, these medications have been reviewed and there are no changes at this time.   4. Mixed hyperlipidemia Continue statin as ordered and reviewed, no changes at this time    Levora Dredge, MD  02/09/2019 8:47 AM

## 2019-02-25 DIAGNOSIS — I70219 Atherosclerosis of native arteries of extremities with intermittent claudication, unspecified extremity: Secondary | ICD-10-CM | POA: Diagnosis not present

## 2019-02-25 DIAGNOSIS — E782 Mixed hyperlipidemia: Secondary | ICD-10-CM | POA: Diagnosis not present

## 2019-02-25 DIAGNOSIS — I2510 Atherosclerotic heart disease of native coronary artery without angina pectoris: Secondary | ICD-10-CM | POA: Diagnosis not present

## 2019-02-25 DIAGNOSIS — I10 Essential (primary) hypertension: Secondary | ICD-10-CM | POA: Diagnosis not present

## 2019-06-05 ENCOUNTER — Other Ambulatory Visit (INDEPENDENT_AMBULATORY_CARE_PROVIDER_SITE_OTHER): Payer: Self-pay | Admitting: Vascular Surgery

## 2019-07-22 DIAGNOSIS — I2510 Atherosclerotic heart disease of native coronary artery without angina pectoris: Secondary | ICD-10-CM | POA: Diagnosis not present

## 2019-07-22 DIAGNOSIS — I10 Essential (primary) hypertension: Secondary | ICD-10-CM | POA: Diagnosis not present

## 2019-07-22 DIAGNOSIS — I70219 Atherosclerosis of native arteries of extremities with intermittent claudication, unspecified extremity: Secondary | ICD-10-CM | POA: Diagnosis not present

## 2019-07-22 DIAGNOSIS — H9193 Unspecified hearing loss, bilateral: Secondary | ICD-10-CM | POA: Diagnosis not present

## 2019-07-24 ENCOUNTER — Telehealth: Payer: Self-pay

## 2019-07-24 NOTE — Telephone Encounter (Signed)
Patient called our office requesting a flu shot without composition of eggs.  He states that he has taken the flu vaccine twice as a child and had the same reaction which includes fever, nausea/vomiting and body aches.  We will attempt to order this flu vaccination without the egg component but we explained to the patient that he may still have the same reaction because this doesn't sound like an egg allergy. Patient verbally understands our advice and will wait to hear from [place] when we receive the vaccination.

## 2019-07-31 ENCOUNTER — Other Ambulatory Visit: Payer: Self-pay

## 2019-07-31 ENCOUNTER — Ambulatory Visit: Payer: Medicare Other

## 2019-07-31 DIAGNOSIS — Z23 Encounter for immunization: Secondary | ICD-10-CM

## 2019-08-12 ENCOUNTER — Other Ambulatory Visit: Payer: Self-pay

## 2019-08-12 ENCOUNTER — Encounter (INDEPENDENT_AMBULATORY_CARE_PROVIDER_SITE_OTHER): Payer: Self-pay | Admitting: Nurse Practitioner

## 2019-08-12 ENCOUNTER — Ambulatory Visit (INDEPENDENT_AMBULATORY_CARE_PROVIDER_SITE_OTHER): Payer: BC Managed Care – PPO | Admitting: Nurse Practitioner

## 2019-08-12 ENCOUNTER — Ambulatory Visit (INDEPENDENT_AMBULATORY_CARE_PROVIDER_SITE_OTHER): Payer: BC Managed Care – PPO

## 2019-08-12 VITALS — BP 154/81 | HR 65 | Resp 16 | Ht 69.0 in | Wt 175.0 lb

## 2019-08-12 DIAGNOSIS — E782 Mixed hyperlipidemia: Secondary | ICD-10-CM

## 2019-08-12 DIAGNOSIS — I10 Essential (primary) hypertension: Secondary | ICD-10-CM | POA: Diagnosis not present

## 2019-08-12 DIAGNOSIS — I70222 Atherosclerosis of native arteries of extremities with rest pain, left leg: Secondary | ICD-10-CM | POA: Diagnosis not present

## 2019-08-12 NOTE — Progress Notes (Signed)
SUBJECTIVE:  Patient ID: Gavin Mendez, male    DOB: [birth year], 63 y.o.   MRN: [id number] Chief Complaint  Patient presents with  . Follow-up    ultrasound    HPI  Gavin Mendez is a 63 y.o. male The patient returns to the office for followup and review of the noninvasive studies. There have been no interval changes in lower extremity symptoms. No interval shortening of the patient's claudication distance or development of rest pain symptoms. No new ulcers or wounds have occurred since the last visit.  There have been no significant changes to the patient's overall health care.  The patient denies amaurosis fugax or recent TIA symptoms. There are no recent neurological changes noted. The patient denies history of DVT, PE or superficial thrombophlebitis. The patient denies recent episodes of angina or shortness of breath.   ABI Rt=1.16 and Lt=1.13  (previous ABI's Rt=1.25 and Lt=1.06) Duplex ultrasound of the bilateral lower extremities reveals biphasic waveforms throughout with strong toe waveforms. Past Medical History:  Diagnosis Date  . Anemia 2005   was on b12 shots briefly and supplements  . Arthritis   . Cardiovascular disease   . High blood pressure   . Myocardial infarction ([place]) 2015   1 stent  . Peripheral vascular disease [facility])     Past Surgical History:  Procedure Laterality Date  . CORONARY ANGIOPLASTY WITH STENT PLACEMENT  2015   1 stent  . LOWER EXTREMITY ANGIOGRAPHY Left 01/23/2019   Procedure: LOWER EXTREMITY ANGIOGRAPHY;  Surgeon: Katha Cabal, MD;  Location: [facility] CV [facility];  Service: Cardiovascular;  Laterality: Left;  . LOWER EXTREMITY ANGIOGRAPHY Left 01/28/2019   Procedure: LOWER EXTREMITY ANGIOGRAPHY;  Surgeon: Katha Cabal, MD;  Location: [facility] CV [facility];  Service: Cardiovascular;  Laterality: Left;    Social History   Socioeconomic History  . Marital status: Married    Spouse name: Earnest Bailey  . Number of children: Not on  file  . Years of education: Not on file  . Highest education level: Not on file  Occupational History  . Occupation: professor of Therapist, art  Social Needs  . Financial resource strain: Not on file  . Food insecurity    Worry: Not on file    Inability: Not on file  . Transportation needs    Medical: Not on file    Non-medical: Not on file  Tobacco Use  . Smoking status: Current Every Day Smoker    Packs/day: 1.00    Years: 45.00    Pack years: 45.00    Types: Cigarettes  . Smokeless tobacco: Never Used  Substance and Sexual Activity  . Alcohol use: Never    Frequency: Never  . Drug use: Never  . Sexual activity: Yes  Lifestyle  . Physical activity    Days per week: Not on file    Minutes per session: Not on file  . Stress: Not on file  Relationships  . Social Herbalist on phone: Not on file    Gets together: Not on file    Attends religious service: Not on file    Active member of club or organization: Not on file    Attends meetings of clubs or organizations: Not on file    Relationship status: Not on file  . Intimate partner violence    Fear of current or ex partner: Not on file    Emotionally abused: Not on file    Physically abused: Not on file  Forced sexual activity: Not on file  Other Topics Concern  . Not on file  Social History Narrative  . Not on file    Family History  Problem Relation Age of Onset  . Diabetes Mother   . Obesity Mother   . Heart attack Father   . Stroke Father     Allergies  Allergen Reactions  . Influenza A (H1n1) Monovalent Vaccine Cough    Cough, fever  20 yrs ago  . Lanolin Rash    rash     Review of Systems   Review of Systems: Negative Unless Checked Constitutional: [] Weight loss  [] Fever  [] Chills Cardiac: [] Chest pain   []  Atrial Fibrillation  [] Palpitations   [] Shortness of breath when laying flat   [] Shortness of breath with exertion. [] Shortness of breath at rest Vascular:  [] Pain in  legs with walking   [] Pain in legs with standing [] Pain in legs when laying flat   [x] Claudication    [] Pain in feet when laying flat    [] History of DVT   [] Phlebitis   [] Swelling in legs   [] Varicose veins   [] Non-healing ulcers Pulmonary:   [] Uses home oxygen   [] Productive cough   [] Hemoptysis   [] Wheeze  [] COPD   [] Asthma Neurologic:  [] Dizziness   [] Seizures  [] Blackouts [] History of stroke   [] History of TIA  [] Aphasia   [] Temporary Blindness   [] Weakness or numbness in arm   [] Weakness or numbness in leg Musculoskeletal:   [] Joint swelling   [] Joint pain   [] Low back pain  []  History of Knee Replacement [x] Arthritis [] back Surgeries  []  Spinal Stenosis    Hematologic:  [] Easy bruising  [] Easy bleeding   [] Hypercoagulable state   [x] Anemic Gastrointestinal:  [] Diarrhea   [] Vomiting  [] Gastroesophageal reflux/heartburn   [] Difficulty swallowing. [] Abdominal pain Genitourinary:  [] Chronic kidney disease   [] Difficult urination  [] Anuric   [] Blood in urine [] Frequent urination  [] Burning with urination   [] Hematuria Skin:  [] Rashes   [] Ulcers [] Wounds Psychological:  [] History of anxiety   []  History of major depression  []  Memory Difficulties      OBJECTIVE:   Physical Exam  BP (!) 154/81 (BP Location: Right Arm)   Pulse 65   Resp 16   Ht 5\' 9"  (1.753 m)   Wt 175 lb (79.4 kg)   BMI 25.84 kg/m   Gen: WD/WN, NAD Head: [place]/AT, No temporalis wasting.  Ear/Nose/Throat: Hearing grossly intact, nares w/o erythema or drainage Eyes: PER, EOMI, sclera nonicteric.  Neck: Supple, no masses.  No JVD.  Pulmonary:  Good air movement, no use of accessory muscles.  Cardiac: RRR Vascular:  Vessel Right Left  Radial Palpable Palpable  Dorsalis Pedis Palpable Palpable  Posterior Tibial Palpable Palpable   Gastrointestinal: soft, non-distended. No guarding/no peritoneal signs.  Musculoskeletal: M/S 5/5 throughout.  No deformity or atrophy.  Neurologic: Pain and light touch intact in extremities.   Symmetrical.  Speech is fluent. Motor exam as listed above. Psychiatric: Judgment intact, Mood & affect appropriate for pt's clinical situation. Dermatologic: No Venous rashes. No Ulcers Noted.  No changes consistent with cellulitis. Lymph : No Cervical lymphadenopathy, no lichenification or skin changes of chronic lymphedema.       ASSESSMENT AND PLAN:  1. Atherosclerosis of native artery of left lower extremity with rest pain (HCC)  Recommend:  The patient has evidence of atherosclerosis of the lower extremities with claudication.  The patient does not voice lifestyle limiting changes at this point in time.  Noninvasive studies do not suggest clinically significant change.  No invasive studies, angiography or surgery at this time The patient should continue walking and begin a more formal exercise program.  The patient should continue antiplatelet therapy and aggressive treatment of the lipid abnormalities  No changes in the patient's medications at this time  The patient should continue wearing graduated compression socks 10-15 mmHg strength to control the mild edema.   - VAS [place] ABI WITH/WO TBI; Future  2. Mixed hyperlipidemia Continue statin as ordered and reviewed, no changes at this time   3. Essential hypertension Continue antihypertensive medications as already ordered, these medications have been reviewed and there are no changes at this time.    Current Outpatient Medications on File Prior to Visit  Medication Sig Dispense Refill  . aspirin 325 MG tablet Take 325 mg by mouth daily.    Marland Kitchen atorvastatin (LIPITOR) 20 MG tablet Take 20 mg by mouth daily.    . clopidogrel (PLAVIX) 75 MG tablet Take 75 mg by mouth every evening.     . metoprolol succinate (TOPROL-XL) 50 MG 24 hr tablet Take 50 mg by mouth every evening.    . Turmeric POWD Take 1 capsule by mouth daily. 400mg  / 90 of turmeric extract / ginger 30 mg     . CHANTIX STARTING MONTH PAK 0.5 MG X 11 & 1 MG X 42  tablet Take 0.5-1 mg by mouth 2 (two) times daily.    gabapentin (NEURONTIN) 300 MG capsule TAKE 1 CAPSULE(300 MG) BY MOUTH AT BEDTIME (Patient not taking: Reported on 08/12/2019) 30 capsule 3  . HYDROcodone-acetaminophen (NORCO) 5-325 MG tablet Take 1-2 tablets by mouth every 6 (six) hours as needed for moderate pain or severe pain. (Patient not taking: Reported on 08/12/2019) 40 tablet 0   No current facility-administered medications on file prior to visit.     There are no Patient Instructions on file for this visit. No follow-ups on file.   08/14/2019, NP  This note was completed with Georgiana Spinner.  Any errors are purely unintentional.

## 2019-09-02 DIAGNOSIS — E782 Mixed hyperlipidemia: Secondary | ICD-10-CM | POA: Diagnosis not present

## 2019-09-02 DIAGNOSIS — I70219 Atherosclerosis of native arteries of extremities with intermittent claudication, unspecified extremity: Secondary | ICD-10-CM | POA: Diagnosis not present

## 2019-09-02 DIAGNOSIS — I10 Essential (primary) hypertension: Secondary | ICD-10-CM | POA: Diagnosis not present

## 2019-09-02 DIAGNOSIS — I2510 Atherosclerotic heart disease of native coronary artery without angina pectoris: Secondary | ICD-10-CM | POA: Diagnosis not present

## 2019-11-06 DIAGNOSIS — H6123 Impacted cerumen, bilateral: Secondary | ICD-10-CM | POA: Diagnosis not present

## 2019-11-06 DIAGNOSIS — H903 Sensorineural hearing loss, bilateral: Secondary | ICD-10-CM | POA: Diagnosis not present

## 2019-11-16 DIAGNOSIS — H903 Sensorineural hearing loss, bilateral: Secondary | ICD-10-CM | POA: Diagnosis not present

## 2020-01-02 DIAGNOSIS — Z23 Encounter for immunization: Secondary | ICD-10-CM | POA: Diagnosis not present

## 2020-01-30 DIAGNOSIS — Z23 Encounter for immunization: Secondary | ICD-10-CM | POA: Diagnosis not present

## 2020-03-21 DIAGNOSIS — E782 Mixed hyperlipidemia: Secondary | ICD-10-CM | POA: Diagnosis not present

## 2020-03-21 DIAGNOSIS — I10 Essential (primary) hypertension: Secondary | ICD-10-CM | POA: Diagnosis not present

## 2020-03-21 DIAGNOSIS — I25118 Atherosclerotic heart disease of native coronary artery with other forms of angina pectoris: Secondary | ICD-10-CM | POA: Diagnosis not present

## 2020-03-21 DIAGNOSIS — I70219 Atherosclerosis of native arteries of extremities with intermittent claudication, unspecified extremity: Secondary | ICD-10-CM | POA: Diagnosis not present

## 2020-04-26 DIAGNOSIS — I25118 Atherosclerotic heart disease of native coronary artery with other forms of angina pectoris: Secondary | ICD-10-CM | POA: Diagnosis not present

## 2020-04-26 DIAGNOSIS — I70219 Atherosclerosis of native arteries of extremities with intermittent claudication, unspecified extremity: Secondary | ICD-10-CM | POA: Diagnosis not present

## 2020-05-02 DIAGNOSIS — I25118 Atherosclerotic heart disease of native coronary artery with other forms of angina pectoris: Secondary | ICD-10-CM | POA: Diagnosis not present

## 2020-05-02 DIAGNOSIS — I70219 Atherosclerosis of native arteries of extremities with intermittent claudication, unspecified extremity: Secondary | ICD-10-CM | POA: Diagnosis not present

## 2020-05-02 DIAGNOSIS — E782 Mixed hyperlipidemia: Secondary | ICD-10-CM | POA: Diagnosis not present

## 2020-05-02 DIAGNOSIS — I10 Essential (primary) hypertension: Secondary | ICD-10-CM | POA: Diagnosis not present

## 2020-08-15 ENCOUNTER — Ambulatory Visit (INDEPENDENT_AMBULATORY_CARE_PROVIDER_SITE_OTHER): Payer: BC Managed Care – PPO | Admitting: Vascular Surgery

## 2020-08-15 ENCOUNTER — Ambulatory Visit (INDEPENDENT_AMBULATORY_CARE_PROVIDER_SITE_OTHER): Payer: BC Managed Care – PPO

## 2020-08-15 ENCOUNTER — Encounter (INDEPENDENT_AMBULATORY_CARE_PROVIDER_SITE_OTHER): Payer: Self-pay | Admitting: Vascular Surgery

## 2020-08-15 ENCOUNTER — Other Ambulatory Visit: Payer: Self-pay

## 2020-08-15 VITALS — BP 156/96 | HR 69 | Ht 69.0 in | Wt 178.0 lb

## 2020-08-15 DIAGNOSIS — I10 Essential (primary) hypertension: Secondary | ICD-10-CM

## 2020-08-15 DIAGNOSIS — I70222 Atherosclerosis of native arteries of extremities with rest pain, left leg: Secondary | ICD-10-CM | POA: Diagnosis not present

## 2020-08-15 DIAGNOSIS — I70219 Atherosclerosis of native arteries of extremities with intermittent claudication, unspecified extremity: Secondary | ICD-10-CM | POA: Diagnosis not present

## 2020-08-15 DIAGNOSIS — I25118 Atherosclerotic heart disease of native coronary artery with other forms of angina pectoris: Secondary | ICD-10-CM | POA: Diagnosis not present

## 2020-08-15 DIAGNOSIS — E782 Mixed hyperlipidemia: Secondary | ICD-10-CM | POA: Diagnosis not present

## 2020-08-15 NOTE — Progress Notes (Signed)
MRN : [id number]  Gavin Mendez is a 64 y.o. ([birth year]) male who presents with chief complaint of  Chief Complaint  Patient presents with   Follow-up    U/S  follow up  .  History of Present Illness:   The patient returns to the office for followup and review status post angiogram with intervention on 01/28/2019.   1.Percutaneous transluminal angioplasty and stent placementleft superficial femoral and popliteal arteries to 6 mm 2. Percutaneous transluminal angioplastyleft anterior tibial  The patient notes improvement in the lower extremity walking but his left foot pain especially at night remains moderate to severe.   No new ulcers or wounds have occurred since the last visit.  He did well with travel and has not had any major setbacks secondary to his trip.  There have been no significant changes to the patient's overall health care.  The patient denies amaurosis fugax or recent TIA symptoms. There are no recent neurological changes noted. The patient denies history of DVT, PE or superficial thrombophlebitis. The patient denies recent episodes of angina or shortness of breath.   ABI's Rt=1.16 and Lt=1.23  (previous ABI's Rt=1.16 and Lt=1.13)  Previous duplex US of the left lower extremity arterial system shows patent arterial system with a widely patent stent  Current Meds  Medication Sig   atorvastatin (LIPITOR) 20 MG tablet Take 20 mg by mouth daily.   clopidogrel (PLAVIX) 75 MG tablet Take 75 mg by mouth every evening.    Glucosamine-Chondroitin 1500-1200 MG/30ML LIQD    metoprolol succinate (TOPROL-XL) 50 MG 24 hr tablet Take 50 mg by mouth every evening.   Turmeric POWD Take 1 capsule by mouth daily. 400mg  / 90 of turmeric extract / ginger 30 mg    [DISCONTINUED] aspirin 325 MG tablet Take 325 mg by mouth daily.   [DISCONTINUED] aspirin 81 MG EC tablet Take by mouth.   [DISCONTINUED] atorvastatin (LIPITOR) 40 MG tablet Take by mouth.    Past  Medical History:  Diagnosis Date   Anemia 2005   was on b12 shots briefly and supplements   Arthritis    Cardiovascular disease    High blood pressure    Myocardial infarction [facility]) 2015   1 stent   Peripheral vascular disease (HCC)     Past Surgical History:  Procedure Laterality Date   CORONARY ANGIOPLASTY WITH STENT PLACEMENT  2015   1 stent   LOWER EXTREMITY ANGIOGRAPHY Left 01/23/2019   Procedure: LOWER EXTREMITY ANGIOGRAPHY;  Surgeon: 03/25/2019, MD;  Location: [facility] INVASIVE CV LAB;  Service: Cardiovascular;  Laterality: Left;   LOWER EXTREMITY ANGIOGRAPHY Left 01/28/2019   Procedure: LOWER EXTREMITY ANGIOGRAPHY;  Surgeon: 03/30/2019, MD;  Location: [facility] INVASIVE CV LAB;  Service: Cardiovascular;  Laterality: Left;    Social History Social History   Tobacco Use   Smoking status: Current Every Day Smoker    Packs/day: 1.00    Years: 45.00    Pack years: 45.00    Types: Cigarettes   Smokeless tobacco: Never Used  Renford Dills Use: Never used  Substance Use Topics   Alcohol use: Never   Drug use: Never    Family History Family History  Problem Relation Age of Onset   Diabetes Mother    Obesity Mother    Heart attack Father    Stroke Father     Allergies  Allergen Reactions   Influenza A (H1n1) Monovalent Vaccine Cough    Cough, fever  20 yrs  ago   Lanolin Rash    rash     REVIEW OF SYSTEMS (Negative unless checked)  Constitutional: [] Weight loss  [] Fever  [] Chills Cardiac: [] Chest pain   [] Chest pressure   [] Palpitations   [] Shortness of breath when laying flat   [] Shortness of breath with exertion. Vascular:  [x] Pain in legs with walking   [] Pain in legs at rest  [] History of DVT   [] Phlebitis   [] Swelling in legs   [] Varicose veins   [] Non-healing ulcers Pulmonary:   [] Uses home oxygen   [] Productive cough   [] Hemoptysis   [] Wheeze  [] COPD   [] Asthma Neurologic:  [] Dizziness   [] Seizures   [] History of stroke    [] History of TIA  [] Aphasia   [] Vissual changes   [] Weakness or numbness in arm   [] Weakness or numbness in leg Musculoskeletal:   [] Joint swelling   [] Joint pain   [] Low back pain Hematologic:  [] Easy bruising  [] Easy bleeding   [] Hypercoagulable state   [] Anemic Gastrointestinal:  [] Diarrhea   [] Vomiting  [] Gastroesophageal reflux/heartburn   [] Difficulty swallowing. Genitourinary:  [] Chronic kidney disease   [] Difficult urination  [] Frequent urination   [] Blood in urine Skin:  [] Rashes   [] Ulcers  Psychological:  [] History of anxiety   []  History of major depression.  Physical Examination  Vitals:   08/15/20 0934  BP: (!) 156/96  Pulse: 69  Weight: 178 lb (80.7 kg)  Height: 5\' 9"  (1.753 m)   Body mass index is 26.29 kg/m. Gen: WD/WN, NAD Head: /AT, No temporalis wasting.  Ear/Nose/Throat: Hearing grossly intact, nares w/o erythema or drainage Eyes: PER, EOMI, sclera nonicteric.  Neck: Supple, no large masses.   Pulmonary:  Good air movement, no audible wheezing bilaterally, no use of accessory muscles.  Cardiac: RRR, no JVD Vascular:  Vessel Right Left  Radial Palpable Palpable  Gastrointestinal: Non-distended. No guarding/no peritoneal signs.  Musculoskeletal: M/S 5/5 throughout.  No deformity or atrophy.  Neurologic: CN 2-12 intact. Symmetrical.  Speech is fluent. Motor exam as listed above. Psychiatric: Judgment intact, Mood & affect appropriate for pt's clinical situation. Dermatologic: No rashes or ulcers noted.  No changes consistent with cellulitis.   CBC Lab Results  Component Value Date   WBC 12.0 (H) 05/20/2014   HGB 13.9 05/20/2014   HCT 41.4 05/20/2014   MCV 96 05/20/2014   PLT 213 05/20/2014    BMET    Component Value Date/Time   NA 137 05/21/2014 0405   K 3.7 05/21/2014 0405   CL 105 05/21/2014 0405   CO2 22 05/21/2014 0405   GLUCOSE 106 (H) 05/21/2014 0405   BUN 15 01/22/2019 1358   BUN 7 05/21/2014 0405   CREATININE 0.67 01/22/2019 1358     CREATININE 0.68 05/21/2014 0405   CALCIUM 8.7 05/21/2014 0405   GFRNONAA >60 01/22/2019 1358   GFRNONAA >60 05/21/2014 0405   GFRAA >60 01/22/2019 1358   GFRAA >60 05/21/2014 0405   CrCl cannot be calculated (Patient's most recent lab result is older than the maximum 21 days allowed.).  COAG No results found for: INR, PROTIME  Radiology No results found.    Assessment/Plan 1. Atherosclerotic peripheral vascular disease with intermittent claudication (HCC) Recommend:  The patient is status post successful angiogram with intervention.  The patient reports that the claudication symptoms are much improved while he still has neuropathic leg pain but this is stable maybe improved.  No further invasive studies, angiography or surgery at this time.  His noninvasive studies are now  normalized.  The patient should continue walking and begin a more formal exercise program.  The patient should continue antiplatelet therapy and aggressive treatment of the lipid abnormalities  Smoking cessation was again discussed  The patient should continue wearing graduated compression socks 10-15 mmHg strength to control the mild edema.  Patient should undergo noninvasive studies as ordered. The patient will follow up with me after the studies.   - VAS [place] ABI WITH/WO TBI; Future  2. Benign essential hypertension Continue antihypertensive medications as already ordered, these medications have been reviewed and there are no changes at this time.   3. Coronary artery disease of native artery of native heart with stable angina pectoris (HCC) Continue cardiac and antihypertensive medications as already ordered and reviewed, no changes at this time.  Continue statin as ordered and reviewed, no changes at this time  Nitrates PRN for chest pain   4. Hyperlipidemia, mixed Continue statin as ordered and reviewed, no changes at this time    Levora Dredge, MD  08/15/2020 9:56 AM

## 2020-08-22 ENCOUNTER — Ambulatory Visit: Payer: Medicare Other

## 2020-11-04 DIAGNOSIS — I70219 Atherosclerosis of native arteries of extremities with intermittent claudication, unspecified extremity: Secondary | ICD-10-CM | POA: Diagnosis not present

## 2020-11-04 DIAGNOSIS — I25118 Atherosclerotic heart disease of native coronary artery with other forms of angina pectoris: Secondary | ICD-10-CM | POA: Diagnosis not present

## 2020-11-04 DIAGNOSIS — I252 Old myocardial infarction: Secondary | ICD-10-CM | POA: Diagnosis not present

## 2020-11-04 DIAGNOSIS — I10 Essential (primary) hypertension: Secondary | ICD-10-CM | POA: Diagnosis not present

## 2020-11-04 DIAGNOSIS — E782 Mixed hyperlipidemia: Secondary | ICD-10-CM | POA: Diagnosis not present

## 2020-11-07 DIAGNOSIS — L570 Actinic keratosis: Secondary | ICD-10-CM | POA: Diagnosis not present

## 2020-12-08 DIAGNOSIS — I6523 Occlusion and stenosis of bilateral carotid arteries: Secondary | ICD-10-CM | POA: Diagnosis not present

## 2020-12-08 DIAGNOSIS — I25118 Atherosclerotic heart disease of native coronary artery with other forms of angina pectoris: Secondary | ICD-10-CM | POA: Diagnosis not present

## 2020-12-08 DIAGNOSIS — R42 Dizziness and giddiness: Secondary | ICD-10-CM | POA: Diagnosis not present

## 2020-12-19 DIAGNOSIS — I70219 Atherosclerosis of native arteries of extremities with intermittent claudication, unspecified extremity: Secondary | ICD-10-CM | POA: Diagnosis not present

## 2020-12-19 DIAGNOSIS — E782 Mixed hyperlipidemia: Secondary | ICD-10-CM | POA: Diagnosis not present

## 2020-12-19 DIAGNOSIS — I10 Essential (primary) hypertension: Secondary | ICD-10-CM | POA: Diagnosis not present

## 2020-12-19 DIAGNOSIS — I25118 Atherosclerotic heart disease of native coronary artery with other forms of angina pectoris: Secondary | ICD-10-CM | POA: Diagnosis not present

## 2020-12-19 DIAGNOSIS — I252 Old myocardial infarction: Secondary | ICD-10-CM | POA: Diagnosis not present

## 2021-03-17 DIAGNOSIS — Z23 Encounter for immunization: Secondary | ICD-10-CM | POA: Diagnosis not present

## 2021-08-02 DIAGNOSIS — I10 Essential (primary) hypertension: Secondary | ICD-10-CM | POA: Diagnosis not present

## 2021-08-02 DIAGNOSIS — I25118 Atherosclerotic heart disease of native coronary artery with other forms of angina pectoris: Secondary | ICD-10-CM | POA: Diagnosis not present

## 2021-08-02 DIAGNOSIS — I70219 Atherosclerosis of native arteries of extremities with intermittent claudication, unspecified extremity: Secondary | ICD-10-CM | POA: Diagnosis not present

## 2021-08-02 DIAGNOSIS — I252 Old myocardial infarction: Secondary | ICD-10-CM | POA: Diagnosis not present

## 2021-08-13 NOTE — Progress Notes (Signed)
MRN : [id number]  Gavin Mendez is a 65 y.o. (November 20, 1955) male who presents with chief complaint of check legs.  History of Present Illness:   The patient returns to the office for followup and review status post angiogram with intervention on 01/23/2019 with a return on 01/28/2019.   Procedure 01/23/2019:  Percutaneous transluminal angioplasty and stent placement right external iliac artery 2.   Percutaneous transluminal angioplasty and stent placement left external iliac artery 3.     Crosser atherectomy with percutaneous transluminal angioplasty left superficial femoral artery unsuccessful.   Procedure 01/28/2019: 1.   Percutaneous transluminal angioplasty and stent placement left superficial femoral and popliteal arteries to 6 mm 2.   Percutaneous transluminal angioplasty left anterior tibial   The patient notes his walking is stable and he does not have significant claudication.   No new ulcers or wounds have occurred since the last visit.     There have been no significant changes to the patient's overall health care.   The patient denies amaurosis fugax or recent TIA symptoms. There are no recent neurological changes noted. The patient denies history of DVT, PE or superficial thrombophlebitis. The patient denies recent episodes of angina or shortness of breath.    ABI's Rt=1.02 and Lt=0.98  (previous ABI's Rt=1.16 and Lt=1.23)   Previous duplex US of the left lower extremity arterial system shows patent arterial system with a widely patent stent  No outpatient medications have been marked as taking for the 08/14/21 encounter (Appointment) with Gilda Crease, Latina Craver, MD.    Past Medical History:  Diagnosis Date   Anemia 2005   was on b12 shots briefly and supplements   Arthritis    Cardiovascular disease    High blood pressure    Myocardial infarction [facility]) 2015   1 stent   Peripheral vascular disease (HCC)     Past Surgical History:  Procedure Laterality Date   CORONARY  ANGIOPLASTY WITH STENT PLACEMENT  2015   1 stent   LOWER EXTREMITY ANGIOGRAPHY Left 01/23/2019   Procedure: LOWER EXTREMITY ANGIOGRAPHY;  Surgeon: Renford Dills, MD;  Location: [facility] INVASIVE CV LAB;  Service: Cardiovascular;  Laterality: Left;   LOWER EXTREMITY ANGIOGRAPHY Left 01/28/2019   Procedure: LOWER EXTREMITY ANGIOGRAPHY;  Surgeon: Renford Dills, MD;  Location: [facility] INVASIVE CV LAB;  Service: Cardiovascular;  Laterality: Left;    Social History Social History   Tobacco Use   Smoking status: Every Day    Packs/day: 1.00    Years: 45.00    Pack years: 45.00    Types: Cigarettes   Smokeless tobacco: Never  Vaping Use   Vaping Use: Never used  Substance Use Topics   Alcohol use: Never   Drug use: Never    Family History Family History  Problem Relation Age of Onset   Diabetes Mother    Obesity Mother    Heart attack Father    Stroke Father     Allergies  Allergen Reactions   Influenza A (H1n1) Monovalent Vaccine Cough    Cough, fever  20 yrs ago   Lanolin Rash    rash     REVIEW OF SYSTEMS (Negative unless checked)  Constitutional: [] Weight loss  [] Fever  [] Chills Cardiac: [] Chest pain   [] Chest pressure   [] Palpitations   [] Shortness of breath when laying flat   [] Shortness of breath with exertion. Vascular:  [] Pain in legs with walking   [] Pain in legs at rest  [] History of DVT   [] Phlebitis   []   Swelling in legs   [] Varicose veins   [] Non-healing ulcers Pulmonary:   [] Uses home oxygen   [] Productive cough   [] Hemoptysis   [] Wheeze  [] COPD   [] Asthma Neurologic:  [] Dizziness   [] Seizures   [] History of stroke   [] History of TIA  [] Aphasia   [] Vissual changes   [] Weakness or numbness in arm   [] Weakness or numbness in leg Musculoskeletal:   [] Joint swelling   [] Joint pain   [] Low back pain Hematologic:  [] Easy bruising  [] Easy bleeding   [] Hypercoagulable state   [] Anemic Gastrointestinal:  [] Diarrhea   [] Vomiting  [] Gastroesophageal reflux/heartburn    [] Difficulty swallowing. Genitourinary:  [] Chronic kidney disease   [] Difficult urination  [] Frequent urination   [] Blood in urine Skin:  [] Rashes   [] Ulcers  Psychological:  [] History of anxiety   []  History of major depression.  Physical Examination  There were no vitals filed for this visit. There is no height or weight on file to calculate BMI. Gen: WD/WN, NAD Head: [place]/AT, No temporalis wasting.  Ear/Nose/Throat: Hearing grossly intact, nares w/o erythema or drainage Eyes: PER, EOMI, sclera nonicteric.  Neck: Supple, no masses.  No bruit or JVD.  Pulmonary:  Good air movement, no audible wheezing, no use of accessory muscles.  Cardiac: RRR, normal S1, S2, no Murmurs. Vascular:   no carotid bruits Vessel Right Left  Radial Palpable Palpable  Carotid Palpable Palpable  Gastrointestinal: soft, non-distended. No guarding/no peritoneal signs.  Musculoskeletal: M/S 5/5 throughout.  No visible deformity.  Neurologic: CN 2-12 intact. Pain and light touch intact in extremities.  Symmetrical.  Speech is fluent. Motor exam as listed above. Psychiatric: Judgment intact, Mood & affect appropriate for pt's clinical situation. Dermatologic: No rashes or ulcers noted.  No changes consistent with cellulitis.   CBC Lab Results  Component Value Date   WBC 12.0 (H) 05/20/2014   HGB 13.9 05/20/2014   HCT 41.4 05/20/2014   MCV 96 05/20/2014   PLT 213 05/20/2014    BMET    Component Value Date/Time   NA 137 05/21/2014 0405   K 3.7 05/21/2014 0405   CL 105 05/21/2014 0405   CO2 22 05/21/2014 0405   GLUCOSE 106 (H) 05/21/2014 0405   BUN 15 01/22/2019 1358   BUN 7 05/21/2014 0405   CREATININE 0.67 01/22/2019 1358   CREATININE 0.68 05/21/2014 0405   CALCIUM 8.7 05/21/2014 0405   GFRNONAA >60 01/22/2019 1358   GFRNONAA >60 05/21/2014 0405   GFRAA >60 01/22/2019 1358   GFRAA >60 05/21/2014 0405   CrCl cannot be calculated (Patient's most recent lab result is older than the maximum 21  days allowed.).  COAG No results found for: INR, PROTIME  Radiology No results found.   Assessment/Plan 1. Atherosclerotic peripheral vascular disease with intermittent claudication (HCC) Recommend:  The patient is status post successful angiogram with intervention.  The patient reports that the claudication symptoms and leg pain is essentially gone.   The patient denies lifestyle limiting changes at this point in time.  No further invasive studies, angiography or surgery at this time The patient should continue walking and begin a more formal exercise program.  The patient should continue antiplatelet therapy and aggressive treatment of the lipid abnormalities  Patient should undergo noninvasive studies as ordered. The patient will follow up with me after the studies.   - VAS ABI WITH/WO TBI; Future  2. Coronary artery disease of native artery of native heart with stable angina pectoris [facility]) Continue cardiac and  antihypertensive medications as already ordered and reviewed, no changes at this time.  Continue statin as ordered and reviewed, no changes at this time  Nitrates PRN for chest pain   3. Essential hypertension Continue antihypertensive medications as already ordered, these medications have been reviewed and there are no changes at this time.   4. Mixed hyperlipidemia Continue statin as ordered and reviewed, no changes at this time    Levora Dredge, MD  08/13/2021 2:58 PM

## 2021-08-14 ENCOUNTER — Encounter (INDEPENDENT_AMBULATORY_CARE_PROVIDER_SITE_OTHER): Payer: Self-pay | Admitting: Vascular Surgery

## 2021-08-14 ENCOUNTER — Ambulatory Visit (INDEPENDENT_AMBULATORY_CARE_PROVIDER_SITE_OTHER): Payer: BC Managed Care – PPO | Admitting: Vascular Surgery

## 2021-08-14 ENCOUNTER — Other Ambulatory Visit: Payer: Self-pay

## 2021-08-14 ENCOUNTER — Ambulatory Visit (INDEPENDENT_AMBULATORY_CARE_PROVIDER_SITE_OTHER): Payer: BC Managed Care – PPO

## 2021-08-14 VITALS — BP 147/72 | HR 82 | Resp 16 | Wt 167.0 lb

## 2021-08-14 DIAGNOSIS — I10 Essential (primary) hypertension: Secondary | ICD-10-CM

## 2021-08-14 DIAGNOSIS — I25118 Atherosclerotic heart disease of native coronary artery with other forms of angina pectoris: Secondary | ICD-10-CM | POA: Diagnosis not present

## 2021-08-14 DIAGNOSIS — E782 Mixed hyperlipidemia: Secondary | ICD-10-CM

## 2021-08-14 DIAGNOSIS — I70219 Atherosclerosis of native arteries of extremities with intermittent claudication, unspecified extremity: Secondary | ICD-10-CM | POA: Diagnosis not present

## 2021-09-13 DIAGNOSIS — I25118 Atherosclerotic heart disease of native coronary artery with other forms of angina pectoris: Secondary | ICD-10-CM | POA: Diagnosis not present

## 2021-09-13 DIAGNOSIS — E538 Deficiency of other specified B group vitamins: Secondary | ICD-10-CM | POA: Diagnosis not present

## 2021-09-13 DIAGNOSIS — I10 Essential (primary) hypertension: Secondary | ICD-10-CM | POA: Diagnosis not present

## 2021-09-13 DIAGNOSIS — I779 Disorder of arteries and arterioles, unspecified: Secondary | ICD-10-CM | POA: Diagnosis not present

## 2021-09-13 DIAGNOSIS — Z125 Encounter for screening for malignant neoplasm of prostate: Secondary | ICD-10-CM | POA: Diagnosis not present

## 2021-09-13 DIAGNOSIS — I70219 Atherosclerosis of native arteries of extremities with intermittent claudication, unspecified extremity: Secondary | ICD-10-CM | POA: Diagnosis not present

## 2021-09-14 ENCOUNTER — Other Ambulatory Visit: Payer: Self-pay

## 2021-09-14 ENCOUNTER — Ambulatory Visit: Payer: Medicare Other

## 2021-09-14 DIAGNOSIS — Z23 Encounter for immunization: Secondary | ICD-10-CM

## 2021-09-14 DIAGNOSIS — Z91012 Allergy to eggs: Secondary | ICD-10-CM

## 2021-09-14 DIAGNOSIS — H6120 Impacted cerumen, unspecified ear: Secondary | ICD-10-CM

## 2021-09-14 NOTE — Progress Notes (Signed)
He also has complaints of ear fullness today, on observation ear has cerumen impaction. Right worse than left  Ear irrigation performed by RN to both ears.  Advised to use Depbrox OTC   Patient tolerated irrigation without side effects RTC as needed

## 2021-09-22 DIAGNOSIS — E538 Deficiency of other specified B group vitamins: Secondary | ICD-10-CM | POA: Diagnosis not present

## 2022-03-16 ENCOUNTER — Other Ambulatory Visit: Payer: Self-pay | Admitting: Internal Medicine

## 2022-03-16 DIAGNOSIS — F17210 Nicotine dependence, cigarettes, uncomplicated: Secondary | ICD-10-CM

## 2022-05-24 ENCOUNTER — Encounter: Payer: Self-pay | Admitting: Ophthalmology

## 2022-06-01 NOTE — Discharge Instructions (Signed)

## 2022-06-04 ENCOUNTER — Encounter: Payer: Self-pay | Admitting: Ophthalmology

## 2022-06-04 ENCOUNTER — Ambulatory Visit: Payer: BC Managed Care – PPO | Admitting: Anesthesiology

## 2022-06-04 ENCOUNTER — Ambulatory Visit
Admission: RE | Admit: 2022-06-04 | Discharge: 2022-06-04 | Disposition: A | Discharge status: Home / Self Care | Payer: BC Managed Care – PPO | Attending: Ophthalmology | Admitting: Ophthalmology

## 2022-06-04 ENCOUNTER — Encounter
Admission: RE | Disposition: A | Discharge status: Home / Self Care | Payer: Self-pay | Source: Home / Self Care | Attending: Ophthalmology

## 2022-06-04 ENCOUNTER — Other Ambulatory Visit: Payer: Self-pay

## 2022-06-04 DIAGNOSIS — I252 Old myocardial infarction: Secondary | ICD-10-CM | POA: Insufficient documentation

## 2022-06-04 DIAGNOSIS — H2512 Age-related nuclear cataract, left eye: Secondary | ICD-10-CM | POA: Diagnosis present

## 2022-06-04 DIAGNOSIS — I2510 Atherosclerotic heart disease of native coronary artery without angina pectoris: Secondary | ICD-10-CM | POA: Insufficient documentation

## 2022-06-04 DIAGNOSIS — I10 Essential (primary) hypertension: Secondary | ICD-10-CM | POA: Diagnosis not present

## 2022-06-04 DIAGNOSIS — Z955 Presence of coronary angioplasty implant and graft: Secondary | ICD-10-CM | POA: Diagnosis not present

## 2022-06-04 DIAGNOSIS — I739 Peripheral vascular disease, unspecified: Secondary | ICD-10-CM | POA: Diagnosis not present

## 2022-06-04 DIAGNOSIS — F17210 Nicotine dependence, cigarettes, uncomplicated: Secondary | ICD-10-CM | POA: Diagnosis not present

## 2022-06-04 HISTORY — PX: CATARACT EXTRACTION W/PHACO: SHX586

## 2022-06-04 HISTORY — DX: Presence of external hearing-aid: Z97.4

## 2022-06-04 SURGERY — PHACOEMULSIFICATION, CATARACT, WITH IOL INSERTION
Anesthesia: Monitor Anesthesia Care | Site: Eye | Laterality: Left

## 2022-06-04 MED ORDER — SIGHTPATH DOSE#1 BSS IO SOLN
INTRAOCULAR | Status: DC | PRN
  Administered 2022-06-04: 15 mL

## 2022-06-04 MED ORDER — LIDOCAINE HCL (PF) 2 % IJ SOLN
INTRAMUSCULAR | Status: DC | PRN
  Administered 2022-06-04: 40 mg via INTRADERMAL

## 2022-06-04 MED ORDER — TETRACAINE HCL 0.5 % OP SOLN
1.0000 [drp] | OPHTHALMIC | Status: DC | PRN
  Administered 2022-06-04 (×3): 1 [drp] via OPHTHALMIC

## 2022-06-04 MED ORDER — ARMC OPHTHALMIC DILATING DROPS
1.0000 | OPHTHALMIC | Status: DC | PRN
  Administered 2022-06-04 (×3): 1 via OPHTHALMIC

## 2022-06-04 MED ORDER — MOXIFLOXACIN HCL 0.5 % OP SOLN
OPHTHALMIC | Status: DC | PRN
  Administered 2022-06-04: 0.2 mL via OPHTHALMIC

## 2022-06-04 MED ORDER — MIDAZOLAM HCL 2 MG/2ML IJ SOLN
INTRAMUSCULAR | Status: DC | PRN
  Administered 2022-06-04: 2 mg via INTRAVENOUS

## 2022-06-04 MED ORDER — LIDOCAINE HCL (PF) 2 % IJ SOLN
INTRAOCULAR | Status: DC | PRN
  Administered 2022-06-04: 1 mL via INTRAOCULAR

## 2022-06-04 MED ORDER — GLYCOPYRROLATE 0.2 MG/ML IJ SOLN
INTRAMUSCULAR | Status: DC | PRN
  Administered 2022-06-04: .2 mg via INTRAVENOUS

## 2022-06-04 MED ORDER — FENTANYL CITRATE (PF) 100 MCG/2ML IJ SOLN
INTRAMUSCULAR | Status: DC | PRN
  Administered 2022-06-04: 100 ug via INTRAVENOUS

## 2022-06-04 MED ORDER — SIGHTPATH DOSE#1 BSS IO SOLN
INTRAOCULAR | Status: DC | PRN
  Administered 2022-06-04: 88 mL via OPHTHALMIC

## 2022-06-04 MED ORDER — SIGHTPATH DOSE#1 NA HYALUR & NA CHOND-NA HYALUR IO KIT
PACK | INTRAOCULAR | Status: DC | PRN
  Administered 2022-06-04: 1 via OPHTHALMIC

## 2022-06-04 SURGICAL SUPPLY — 14 items
CATARACT SUITE SIGHTPATH (MISCELLANEOUS) ×2 IMPLANT
DISSECTOR HYDRO NUCLEUS 50X22 (MISCELLANEOUS) ×2 IMPLANT
FEE CATARACT SUITE SIGHTPATH (MISCELLANEOUS) ×1 IMPLANT
GLOVE SURG GAMMEX PI TX LF 7.5 (GLOVE) ×2 IMPLANT
GLOVE SURG SYN 8.5  E (GLOVE) ×1
GLOVE SURG SYN 8.5 E (GLOVE) ×1 IMPLANT
GLOVE SURG SYN 8.5 PF PI (GLOVE) ×1 IMPLANT
LENS IOL TECNIS EYHANCE 19.0 (Intraocular Lens) ×1 IMPLANT
NDL FILTER BLUNT 18X1 1/2 (NEEDLE) ×1 IMPLANT
NEEDLE FILTER BLUNT 18X 1/2SAF (NEEDLE) ×1
NEEDLE FILTER BLUNT 18X1 1/2 (NEEDLE) ×1 IMPLANT
SYR 3ML LL SCALE MARK (SYRINGE) ×2 IMPLANT
SYR 5ML LL (SYRINGE) ×2 IMPLANT
WATER STERILE IRR 250ML POUR (IV SOLUTION) ×2 IMPLANT

## 2022-06-04 NOTE — Op Note (Signed)
OPERATIVE NOTE  Ryaan Vanwagoner [id number] 06/04/2022   PREOPERATIVE DIAGNOSIS:  Nuclear sclerotic cataract left eye.  H25.12   POSTOPERATIVE DIAGNOSIS:    Nuclear sclerotic cataract left eye.     PROCEDURE:  Phacoemusification with posterior chamber intraocular lens placement of the left eye   LENS:   Implant Name Type Inv. Item Serial No. Manufacturer Lot No. LRB No. Used Action  LENS IOL TECNIS EYHANCE 19.0 - [id number] Intraocular Lens LENS IOL TECNIS EYHANCE 19.0 [id number] [employer]  Left 1 Implanted      Procedure(s) with comments: CATARACT EXTRACTION PHACO AND INTRAOCULAR LENS PLACEMENT (IOC) LEFT (Left) - 6.44 00:46.1   DIB00 +19.0   ULTRASOUND TIME: 0 minutes 46 seconds.  CDE 6.44   SURGEON:  Willey Blade, MD, MPH   ANESTHESIA:  Topical with tetracaine drops augmented with 1% preservative-free intracameral lidocaine.  ESTIMATED BLOOD LOSS: <1 mL   COMPLICATIONS:  None.   DESCRIPTION OF PROCEDURE:  The patient was identified in the holding room and transported to the operating room and placed in the supine position under the operating microscope.  The left eye was identified as the operative eye and it was prepped and draped in the usual sterile ophthalmic fashion.   A 1.0 millimeter clear-corneal paracentesis was made at the 5:00 position. 0.5 ml of preservative-free 1% lidocaine with epinephrine was injected into the anterior chamber.  The anterior chamber was filled with viscoelastic.  A 2.4 millimeter keratome was used to make a near-clear corneal incision at the 2:00 position.  A curvilinear capsulorrhexis was made with a cystotome and capsulorrhexis forceps.  Balanced salt solution was used to hydrodissect and hydrodelineate the nucleus.   Phacoemulsification was then used in stop and chop fashion to remove the lens nucleus and epinucleus.  The remaining cortex was then removed using the irrigation and aspiration handpiece. Viscoelastic was then placed into the  capsular bag to distend it for lens placement.  A lens was then injected into the capsular bag.  The remaining viscoelastic was aspirated.   Wounds were hydrated with balanced salt solution.  The anterior chamber was inflated to a physiologic pressure with balanced salt solution.  Intracameral vigamox 0.1 mL undiltued was injected into the eye and a drop placed onto the ocular surface.  No wound leaks were noted.  The patient was taken to the recovery room in stable condition without complications of anesthesia or surgery  Willey Blade 06/04/2022, 12:37 PM

## 2022-06-04 NOTE — Anesthesia Postprocedure Evaluation (Signed)
Anesthesia Post Note  Patient: Gavin Mendez  Procedure(s) Performed: CATARACT EXTRACTION PHACO AND INTRAOCULAR LENS PLACEMENT (IOC) LEFT (Left: Eye)     Patient location during evaluation: PACU Anesthesia Type: MAC Level of consciousness: awake and alert Pain management: pain level controlled Vital Signs Assessment: post-procedure vital signs reviewed and stable Respiratory status: spontaneous breathing and nonlabored ventilation Cardiovascular status: blood pressure returned to baseline Postop Assessment: no apparent nausea or vomiting Anesthetic complications: no   No notable events documented.  Alesha Jaffee [employer]

## 2022-06-04 NOTE — H&P (Signed)
[facility]   Primary Care Physician:  Lauro Regulus, MD Ophthalmologist: Dr. Willey Blade  Pre-Procedure History & Physical: HPI:  Gavin Mendez is a 66 y.o. male here for cataract surgery.   Past Medical History:  Diagnosis Date   Anemia 2005   was on b12 shots briefly and supplements   Arthritis    Cardiovascular disease    Hearing aid worn    bilateral   High blood pressure    Myocardial infarction [facility]) 2015   1 stent   Peripheral vascular disease (HCC)     Past Surgical History:  Procedure Laterality Date   CORONARY ANGIOPLASTY WITH STENT PLACEMENT  2015   1 stent   LOWER EXTREMITY ANGIOGRAPHY Left 01/23/2019   Procedure: LOWER EXTREMITY ANGIOGRAPHY;  Surgeon: Renford Dills, MD;  Location: [facility] INVASIVE CV LAB;  Service: Cardiovascular;  Laterality: Left;   LOWER EXTREMITY ANGIOGRAPHY Left 01/28/2019   Procedure: LOWER EXTREMITY ANGIOGRAPHY;  Surgeon: Renford Dills, MD;  Location: [facility] INVASIVE CV LAB;  Service: Cardiovascular;  Laterality: Left;    Prior to Admission medications   Medication Sig Start Date End Date Taking? Authorizing Provider  atorvastatin (LIPITOR) 20 MG tablet Take 80 mg by mouth daily.   Yes [provider]  clopidogrel (PLAVIX) 75 MG tablet Take 75 mg by mouth every evening.    Yes [provider]  Glucosamine-Chondroitin 1500-1200 MG/30ML LIQD  02/18/20  Yes [provider]  metoprolol succinate (TOPROL-XL) 50 MG 24 hr tablet Take 50 mg by mouth every evening. 12/29/18  Yes [provider]  Turmeric POWD Take 1 capsule by mouth daily. 400mg  / 90 of turmeric extract / ginger 30 mg    Yes [provider]  vitamin B-12 (CYANOCOBALAMIN) 1000 MCG tablet Take 1,000 mcg by mouth in the morning and at bedtime.   Yes [provider]  Zinc 50 MG TABS Take 50 mg by mouth every other day.   Yes [provider]  aspirin EC 81 MG tablet Take 81 mg by mouth. Every 3rd  day Patient not taking: Reported on 05/24/2022    [provider]  CHANTIX STARTING MONTH PAK 0.5 MG X 11 & 1 MG X 42 tablet Take 0.5-1 mg by mouth 2 (two) times daily. Patient not taking: Reported on 05/24/2022 01/25/19   [provider]  gabapentin (NEURONTIN) 300 MG capsule TAKE 1 CAPSULE(300 MG) BY MOUTH AT BEDTIME Patient not taking: Reported on 08/12/2019 06/08/19   Schnier, 06/10/19, MD  HYDROcodone-acetaminophen (NORCO) 5-325 MG tablet Take 1-2 tablets by mouth every 6 (six) hours as needed for moderate pain or severe pain. Patient not taking: Reported on 08/12/2019 01/28/19   Schnier, 03/30/19, MD    Allergies as of 05/10/2022 - Review Complete 09/14/2021  Allergen Reaction Noted   Influenza a (h1n1) monovalent vaccine Cough 01/16/2019   Lanolin Rash 01/22/2019    Family History  Problem Relation Age of Onset   Diabetes Mother    Obesity Mother    Heart attack Father    Stroke Father     Social History   Socioeconomic History   Marital status: Married    Spouse name: 03/24/2019   Number of children: Not on file   Years of education: Not on file   Highest education level: Not on file  Occupational History   Occupation: professor of Business Communication  Tobacco Use   Smoking status: Every Day    Packs/day: 1.00    Years:  45.00    Total pack years: 45.00    Types: Cigarettes   Smokeless tobacco: Never  Vaping Use   Vaping Use: Never used  Substance and Sexual Activity   Alcohol use: Never   Drug use: Never   Sexual activity: Yes  Other Topics Concern   Not on file  Social History Narrative   Not on file   Social Determinants of Health   Financial Resource Strain: Not on file  Food Insecurity: Not on file  Transportation Needs: Not on file  Physical Activity: Not on file  Stress: Not on file  Social Connections: Not on file  Intimate Partner Violence: Not on file    Review of Systems: See HPI, otherwise negative ROS  Physical Exam: BP  (!) 145/74   Pulse 61   Temp 98.1 F (36.7 C) (Temporal)   Resp 20   Ht 5\' 9"  (1.753 m)   Wt 74.4 kg   SpO2 97%   BMI 24.22 kg/m  General:   Alert, cooperative in NAD Head:  Normocephalic and atraumatic. Respiratory:  Normal work of breathing. Cardiovascular:  RRR  Impression/Plan: Gavin Mendez is here for cataract surgery.  Risks, benefits, limitations, and alternatives regarding cataract surgery have been reviewed with the patient.  Questions have been answered.  All parties agreeable.   [name], MD  06/04/2022, 12:15 PM

## 2022-06-04 NOTE — Transfer of Care (Signed)
Immediate Anesthesia Transfer of Care Note  Patient: Gavin Mendez  Procedure(s) Performed: CATARACT EXTRACTION PHACO AND INTRAOCULAR LENS PLACEMENT (IOC) LEFT (Left: Eye)  Patient Location: PACU  Anesthesia Type: MAC  Level of Consciousness: awake, alert  and patient cooperative  Airway and Oxygen Therapy: Patient Spontanous Breathing and Patient connected to supplemental oxygen  Post-op Assessment: Post-op Vital signs reviewed, Patient's Cardiovascular Status Stable, Respiratory Function Stable, Patent Airway and No signs of Nausea or vomiting  Post-op Vital Signs: Reviewed and stable  Complications: No notable events documented.

## 2022-06-04 NOTE — Anesthesia Preprocedure Evaluation (Signed)
Anesthesia Evaluation  Patient identified by MRN, date of birth, ID band Patient awake    Reviewed: Allergy & Precautions, NPO status , Patient's Chart, lab work & pertinent test results  Airway Mallampati: II  TM Distance: >3 FB Neck ROM: Full    Dental no notable dental hx.    Pulmonary Current Smoker (45 pack years) and Patient abstained from smoking.,    Pulmonary exam normal        Cardiovascular hypertension, + CAD, + Past MI, + Cardiac Stents (Stent x1 in 2015) and + Peripheral Vascular Disease  Normal cardiovascular exam     Neuro/Psych negative neurological ROS  negative psych ROS   GI/Hepatic negative GI ROS, Neg liver ROS,   Endo/Other  negative endocrine ROS  Renal/GU negative Renal ROS     Musculoskeletal  (+) Arthritis ,   Abdominal Normal abdominal exam  (+)   Peds  Hematology negative hematology ROS (+)   Anesthesia Other Findings   Reproductive/Obstetrics                             Anesthesia Physical Anesthesia Plan  ASA: 3  Anesthesia Plan: MAC   Post-op Pain Management: Minimal or no pain anticipated   Induction: Intravenous  PONV Risk Score and Plan: TIVA, Midazolam and Treatment may vary due to age or medical condition  Airway Management Planned: Nasal Cannula and Natural Airway  Additional Equipment:   Intra-op Plan:   Post-operative Plan:   Informed Consent: I have reviewed the patients History and Physical, chart, labs and discussed the procedure including the risks, benefits and alternatives for the proposed anesthesia with the patient or authorized representative who has indicated his/her understanding and acceptance.     Dental advisory given  Plan Discussed with: CRNA  Anesthesia Plan Comments:         Anesthesia Quick Evaluation

## 2022-06-05 ENCOUNTER — Encounter: Payer: Self-pay | Admitting: Ophthalmology

## 2022-06-07 ENCOUNTER — Encounter: Payer: Self-pay | Admitting: Ophthalmology

## 2022-06-14 NOTE — Discharge Instructions (Signed)

## 2022-06-17 NOTE — Anesthesia Preprocedure Evaluation (Signed)
Anesthesia Evaluation  Patient identified by MRN, date of birth, ID band Patient awake    Reviewed: Allergy & Precautions, NPO status , Patient's Chart, lab work & pertinent test results, reviewed documented beta blocker date and time   Airway Mallampati: II  TM Distance: >3 FB Neck ROM: Full    Dental no notable dental hx.    Pulmonary Current Smoker and Patient abstained from smoking.,    Pulmonary exam normal        Cardiovascular hypertension, Pt. on home beta blockers + CAD, + Past MI, + Cardiac Stents (Stent x1 in 2015) and + Peripheral Vascular Disease  Normal cardiovascular exam     Neuro/Psych negative neurological ROS  negative psych ROS   GI/Hepatic negative GI ROS, Neg liver ROS,   Endo/Other  negative endocrine ROS  Renal/GU negative Renal ROS     Musculoskeletal  (+) Arthritis ,   Abdominal Normal abdominal exam  (+)   Peds  Hematology negative hematology ROS (+)   Anesthesia Other Findings   Reproductive/Obstetrics                             Anesthesia Physical  Anesthesia Plan  ASA: 3  Anesthesia Plan: MAC   Post-op Pain Management: Minimal or no pain anticipated   Induction: Intravenous  PONV Risk Score and Plan: TIVA, Midazolam and Treatment may vary due to age or medical condition  Airway Management Planned: Nasal Cannula and Natural Airway  Additional Equipment:   Intra-op Plan:   Post-operative Plan:   Informed Consent:     Dental advisory given  Plan Discussed with: CRNA  Anesthesia Plan Comments:         Anesthesia Quick Evaluation

## 2022-06-18 ENCOUNTER — Ambulatory Visit
Admission: RE | Admit: 2022-06-18 | Discharge: 2022-06-18 | Disposition: A | Discharge status: Home / Self Care | Payer: BC Managed Care – PPO | Attending: Ophthalmology | Admitting: Ophthalmology

## 2022-06-18 ENCOUNTER — Encounter
Admission: RE | Disposition: A | Discharge status: Home / Self Care | Payer: Self-pay | Source: Home / Self Care | Attending: Ophthalmology

## 2022-06-18 ENCOUNTER — Other Ambulatory Visit: Payer: Self-pay

## 2022-06-18 ENCOUNTER — Ambulatory Visit: Payer: BC Managed Care – PPO | Admitting: Anesthesiology

## 2022-06-18 ENCOUNTER — Encounter: Payer: Self-pay | Admitting: Ophthalmology

## 2022-06-18 DIAGNOSIS — I252 Old myocardial infarction: Secondary | ICD-10-CM | POA: Insufficient documentation

## 2022-06-18 DIAGNOSIS — I739 Peripheral vascular disease, unspecified: Secondary | ICD-10-CM | POA: Insufficient documentation

## 2022-06-18 DIAGNOSIS — H2511 Age-related nuclear cataract, right eye: Secondary | ICD-10-CM | POA: Insufficient documentation

## 2022-06-18 DIAGNOSIS — Z955 Presence of coronary angioplasty implant and graft: Secondary | ICD-10-CM | POA: Diagnosis not present

## 2022-06-18 DIAGNOSIS — F17210 Nicotine dependence, cigarettes, uncomplicated: Secondary | ICD-10-CM | POA: Diagnosis not present

## 2022-06-18 DIAGNOSIS — I10 Essential (primary) hypertension: Secondary | ICD-10-CM | POA: Diagnosis not present

## 2022-06-18 DIAGNOSIS — I2510 Atherosclerotic heart disease of native coronary artery without angina pectoris: Secondary | ICD-10-CM | POA: Insufficient documentation

## 2022-06-18 HISTORY — PX: CATARACT EXTRACTION W/PHACO: SHX586

## 2022-06-18 SURGERY — PHACOEMULSIFICATION, CATARACT, WITH IOL INSERTION
Anesthesia: Monitor Anesthesia Care | Site: Eye | Laterality: Right

## 2022-06-18 MED ORDER — SIGHTPATH DOSE#1 BSS IO SOLN
INTRAOCULAR | Status: DC | PRN
  Administered 2022-06-18: 67 mL via OPHTHALMIC

## 2022-06-18 MED ORDER — MIDAZOLAM HCL 2 MG/2ML IJ SOLN
INTRAMUSCULAR | Status: DC | PRN
  Administered 2022-06-18: 2 mg via INTRAVENOUS

## 2022-06-18 MED ORDER — SIGHTPATH DOSE#1 BSS IO SOLN
INTRAOCULAR | Status: DC | PRN
  Administered 2022-06-18: 15 mL

## 2022-06-18 MED ORDER — FENTANYL CITRATE (PF) 100 MCG/2ML IJ SOLN
INTRAMUSCULAR | Status: DC | PRN
  Administered 2022-06-18: 100 ug via INTRAVENOUS

## 2022-06-18 MED ORDER — MOXIFLOXACIN HCL 0.5 % OP SOLN
OPHTHALMIC | Status: DC | PRN
  Administered 2022-06-18: 0.2 mL via OPHTHALMIC

## 2022-06-18 MED ORDER — LIDOCAINE HCL (PF) 2 % IJ SOLN
INTRAOCULAR | Status: DC | PRN
  Administered 2022-06-18: 1 mL via INTRAOCULAR

## 2022-06-18 MED ORDER — ARMC OPHTHALMIC DILATING DROPS
1.0000 | OPHTHALMIC | Status: DC | PRN
  Administered 2022-06-18 (×3): 1 via OPHTHALMIC

## 2022-06-18 MED ORDER — TETRACAINE HCL 0.5 % OP SOLN
1.0000 [drp] | OPHTHALMIC | Status: DC | PRN
  Administered 2022-06-18 (×3): 1 [drp] via OPHTHALMIC

## 2022-06-18 MED ORDER — SIGHTPATH DOSE#1 NA HYALUR & NA CHOND-NA HYALUR IO KIT
PACK | INTRAOCULAR | Status: DC | PRN
  Administered 2022-06-18: 1 via OPHTHALMIC

## 2022-06-18 SURGICAL SUPPLY — 14 items
CATARACT SUITE SIGHTPATH (MISCELLANEOUS) ×2 IMPLANT
DISSECTOR HYDRO NUCLEUS 50X22 (MISCELLANEOUS) ×2 IMPLANT
FEE CATARACT SUITE SIGHTPATH (MISCELLANEOUS) ×1 IMPLANT
GLOVE SURG GAMMEX PI TX LF 7.5 (GLOVE) ×2 IMPLANT
GLOVE SURG SYN 8.5  E (GLOVE) ×1
GLOVE SURG SYN 8.5 E (GLOVE) ×1 IMPLANT
GLOVE SURG SYN 8.5 PF PI (GLOVE) ×1 IMPLANT
LENS IOL TECNIS EYHANCE 19.0 (Intraocular Lens) ×1 IMPLANT
NDL FILTER BLUNT 18X1 1/2 (NEEDLE) ×1 IMPLANT
NEEDLE FILTER BLUNT 18X 1/2SAF (NEEDLE) ×1
NEEDLE FILTER BLUNT 18X1 1/2 (NEEDLE) ×1 IMPLANT
SYR 3ML LL SCALE MARK (SYRINGE) ×2 IMPLANT
SYR 5ML LL (SYRINGE) ×2 IMPLANT
WATER STERILE IRR 250ML POUR (IV SOLUTION) ×2 IMPLANT

## 2022-06-18 NOTE — Transfer of Care (Signed)
Immediate Anesthesia Transfer of Care Note  Patient: Gavin Mendez  Procedure(s) Performed: CATARACT EXTRACTION PHACO AND INTRAOCULAR LENS PLACEMENT (IOC) RIGHT (Right: Eye)  Patient Location: PACU  Anesthesia Type: MAC  Level of Consciousness: awake, alert  and patient cooperative  Airway and Oxygen Therapy: Patient Spontanous Breathing and Patient connected to supplemental oxygen  Post-op Assessment: Post-op Vital signs reviewed, Patient's Cardiovascular Status Stable, Respiratory Function Stable, Patent Airway and No signs of Nausea or vomiting  Post-op Vital Signs: Reviewed and stable  Complications: No notable events documented.

## 2022-06-18 NOTE — H&P (Signed)
[facility]   Primary Care Physician:  Lauro Regulus, MD Ophthalmologist: Dr. Willey Blade  Pre-Procedure History & Physical: HPI:  Gavin Mendez is a 66 y.o. male here for cataract surgery.   Past Medical History:  Diagnosis Date   Anemia 2005   was on b12 shots briefly and supplements   Arthritis    Cardiovascular disease    Hearing aid worn    bilateral   High blood pressure    Myocardial infarction [facility]) 2015   1 stent   Peripheral vascular disease [facility])     Past Surgical History:  Procedure Laterality Date   CATARACT EXTRACTION W/PHACO Left 06/04/2022   Procedure: CATARACT EXTRACTION PHACO AND INTRAOCULAR LENS PLACEMENT (IOC) LEFT;  Surgeon: Nevada Crane, MD;  Location: [facility];  Service: Ophthalmology;  Laterality: Left;  6.44 00:46.1    CORONARY ANGIOPLASTY WITH STENT PLACEMENT  2015   1 stent   LOWER EXTREMITY ANGIOGRAPHY Left 01/23/2019   Procedure: LOWER EXTREMITY ANGIOGRAPHY;  Surgeon: Renford Dills, MD;  Location: [facility] INVASIVE CV LAB;  Service: Cardiovascular;  Laterality: Left;   LOWER EXTREMITY ANGIOGRAPHY Left 01/28/2019   Procedure: LOWER EXTREMITY ANGIOGRAPHY;  Surgeon: Renford Dills, MD;  Location: [facility] INVASIVE CV LAB;  Service: Cardiovascular;  Laterality: Left;    Prior to Admission medications   Medication Sig Start Date End Date Taking? Authorizing Provider  atorvastatin (LIPITOR) 20 MG tablet Take 80 mg by mouth daily.   Yes [provider]  clopidogrel (PLAVIX) 75 MG tablet Take 75 mg by mouth every evening.    Yes [provider]  gabapentin (NEURONTIN) 300 MG capsule TAKE 1 CAPSULE(300 MG) BY MOUTH AT BEDTIME 06/08/19  Yes Schnier, Latina Craver, MD  Glucosamine-Chondroitin 1500-1200 MG/30ML LIQD  02/18/20  Yes [provider]  metoprolol succinate (TOPROL-XL) 50 MG 24 hr tablet Take 50 mg by mouth every evening. 12/29/18  Yes [provider]  Turmeric POWD Take 1 capsule by  mouth daily. 400mg  / 90 of turmeric extract / ginger 30 mg    Yes [provider]  vitamin B-12 (CYANOCOBALAMIN) 1000 MCG tablet Take 1,000 mcg by mouth in the morning and at bedtime.   Yes [provider]  Zinc 50 MG TABS Take 50 mg by mouth every other day.   Yes [provider]  aspirin EC 81 MG tablet Take 81 mg by mouth. Every 3rd day Patient not taking: Reported on 05/24/2022    [provider]  CHANTIX STARTING MONTH PAK 0.5 MG X 11 & 1 MG X 42 tablet Take 0.5-1 mg by mouth 2 (two) times daily. Patient not taking: Reported on 05/24/2022 01/25/19   [provider]  HYDROcodone-acetaminophen (NORCO) 5-325 MG tablet Take 1-2 tablets by mouth every 6 (six) hours as needed for moderate pain or severe pain. Patient not taking: Reported on 08/12/2019 01/28/19   Schnier, 03/30/19, MD    Allergies as of 05/10/2022 - Review Complete 09/14/2021  Allergen Reaction Noted   Influenza a (h1n1) monovalent vaccine Cough 01/16/2019   Lanolin Rash 01/22/2019    Family History  Problem Relation Age of Onset   Diabetes Mother    Obesity Mother    Heart attack Father    Stroke Father     Social History   Socioeconomic History   Marital status: Married    Spouse name: 03/24/2019   Number of children: Not on file   Years of education: Not on file  Highest education level: Not on file  Occupational History   Occupation: professor of Business Communication  Tobacco Use   Smoking status: Every Day    Packs/day: 1.00    Years: 45.00    Total pack years: 45.00    Types: Cigarettes   Smokeless tobacco: Never  Vaping Use   Vaping Use: Never used  Substance and Sexual Activity   Alcohol use: Never   Drug use: Never   Sexual activity: Yes  Other Topics Concern   Not on file  Social History Narrative   Not on file   Social Determinants of Health   Financial Resource Strain: Not on file  Food Insecurity: Not on file  Transportation Needs: Not on file   Physical Activity: Not on file  Stress: Not on file  Social Connections: Not on file  Intimate Partner Violence: Not on file    Review of Systems: See HPI, otherwise negative ROS  Physical Exam: BP (!) 143/72   Pulse 63   Temp 98.1 F (36.7 C) (Temporal)   Ht 5\' 9"  (1.753 m)   Wt 75 kg   SpO2 97%   BMI 24.43 kg/m  General:   Alert, cooperative in NAD Head:  Normocephalic and atraumatic. Respiratory:  Normal work of breathing. Cardiovascular:  RRR  Impression/Plan: Gavin Mendez is here for cataract surgery.  Risks, benefits, limitations, and alternatives regarding cataract surgery have been reviewed with the patient.  Questions have been answered.  All parties agreeable.   [name], MD  06/18/2022, 12:14 PM

## 2022-06-18 NOTE — Op Note (Signed)
OPERATIVE NOTE  Jeral Zick [id number] 06/18/2022   PREOPERATIVE DIAGNOSIS:  Nuclear sclerotic cataract right eye.  H25.11   POSTOPERATIVE DIAGNOSIS:    Nuclear sclerotic cataract right eye.     PROCEDURE:  Phacoemusification with posterior chamber intraocular lens placement of the right eye   LENS:   Implant Name Type Inv. Item Serial No. Manufacturer Lot No. LRB No. Used Action  LENS IOL TECNIS EYHANCE 19.0 - [id number] Intraocular Lens LENS IOL TECNIS EYHANCE 19.0 [id number] [employer]  Right 1 Implanted       Procedure(s) with comments: CATARACT EXTRACTION PHACO AND INTRAOCULAR LENS PLACEMENT (IOC) RIGHT (Right) - 2.91 00:27.5  DIB00 +19.0   ULTRASOUND TIME: 0 minutes 27.5 seconds.  CDE 2.91   SURGEON:  Willey Blade, MD, MPH  ANESTHESIOLOGIST: Anesthesiologist: Foye Deer, MD CRNA: Hezzie Bump, CRNA   ANESTHESIA:  Topical with tetracaine drops augmented with 1% preservative-free intracameral lidocaine.  ESTIMATED BLOOD LOSS: less than 1 mL.   COMPLICATIONS:  None.   DESCRIPTION OF PROCEDURE:  The patient was identified in the holding room and transported to the operating room and placed in the supine position under the operating microscope.  The right eye was identified as the operative eye and it was prepped and draped in the usual sterile ophthalmic fashion.   A 1.0 millimeter clear-corneal paracentesis was made at the 10:30 position. 0.5 ml of preservative-free 1% lidocaine with epinephrine was injected into the anterior chamber.  The anterior chamber was filled with viscoelastic.  A 2.4 millimeter keratome was used to make a near-clear corneal incision at the 8:00 position.  A curvilinear capsulorrhexis was made with a cystotome and capsulorrhexis forceps.  Balanced salt solution was used to hydrodissect and hydrodelineate the nucleus.   Phacoemulsification was then used in stop and chop fashion to remove the lens nucleus and epinucleus.  The  remaining cortex was then removed using the irrigation and aspiration handpiece. Viscoelastic was then placed into the capsular bag to distend it for lens placement.  A lens was then injected into the capsular bag.  The remaining viscoelastic was aspirated.   Wounds were hydrated with balanced salt solution.  The anterior chamber was inflated to a physiologic pressure with balanced salt solution.   Intracameral vigamox 0.1 mL undiluted was injected into the eye and a drop placed onto the ocular surface.  No wound leaks were noted.  The patient was taken to the recovery room in stable condition without complications of anesthesia or surgery  Willey Blade 06/18/2022, 12:43 PM

## 2022-06-18 NOTE — Anesthesia Postprocedure Evaluation (Signed)
Anesthesia Post Note  Patient: Gavin Mendez  Procedure(s) Performed: CATARACT EXTRACTION PHACO AND INTRAOCULAR LENS PLACEMENT (IOC) RIGHT (Right: Eye)     Patient location during evaluation: PACU Anesthesia Type: MAC Level of consciousness: awake and alert Pain management: pain level controlled Vital Signs Assessment: post-procedure vital signs reviewed and stable Respiratory status: spontaneous breathing, nonlabored ventilation and respiratory function stable Cardiovascular status: blood pressure returned to baseline and stable Postop Assessment: no apparent nausea or vomiting Anesthetic complications: no   No notable events documented.  [place] Ouch

## 2022-06-18 NOTE — Anesthesia Procedure Notes (Signed)
Procedure Name: MAC Date/Time: 06/18/2022 12:28 PM  Performed by: Tollie Eth, CRNAPre-anesthesia Checklist: Patient identified, Emergency Drugs available, Suction available and Patient being monitored Patient Re-evaluated:Patient Re-evaluated prior to induction Oxygen Delivery Method: Nasal cannula Induction Type: IV induction Placement Confirmation: positive ETCO2

## 2022-08-14 ENCOUNTER — Other Ambulatory Visit (INDEPENDENT_AMBULATORY_CARE_PROVIDER_SITE_OTHER): Payer: Self-pay | Admitting: Vascular Surgery

## 2022-08-14 DIAGNOSIS — Z9889 Other specified postprocedural states: Secondary | ICD-10-CM

## 2022-08-16 ENCOUNTER — Encounter (INDEPENDENT_AMBULATORY_CARE_PROVIDER_SITE_OTHER): Payer: Medicare Other

## 2022-08-16 ENCOUNTER — Ambulatory Visit (INDEPENDENT_AMBULATORY_CARE_PROVIDER_SITE_OTHER): Payer: Medicare Other | Admitting: Vascular Surgery

## 2022-09-17 ENCOUNTER — Encounter (INDEPENDENT_AMBULATORY_CARE_PROVIDER_SITE_OTHER): Payer: Self-pay

## 2023-01-29 DIAGNOSIS — Z1211 Encounter for screening for malignant neoplasm of colon: Secondary | ICD-10-CM | POA: Diagnosis not present

## 2023-01-29 DIAGNOSIS — Z7902 Long term (current) use of antithrombotics/antiplatelets: Secondary | ICD-10-CM | POA: Diagnosis not present

## 2023-02-25 DIAGNOSIS — I739 Peripheral vascular disease, unspecified: Secondary | ICD-10-CM | POA: Diagnosis not present

## 2023-02-25 DIAGNOSIS — Z720 Tobacco use: Secondary | ICD-10-CM | POA: Diagnosis not present

## 2023-02-25 DIAGNOSIS — E782 Mixed hyperlipidemia: Secondary | ICD-10-CM | POA: Diagnosis not present

## 2023-02-25 DIAGNOSIS — I779 Disorder of arteries and arterioles, unspecified: Secondary | ICD-10-CM | POA: Diagnosis not present

## 2023-02-25 DIAGNOSIS — I25118 Atherosclerotic heart disease of native coronary artery with other forms of angina pectoris: Secondary | ICD-10-CM | POA: Diagnosis not present

## 2023-02-25 DIAGNOSIS — I10 Essential (primary) hypertension: Secondary | ICD-10-CM | POA: Diagnosis not present

## 2023-02-25 DIAGNOSIS — I252 Old myocardial infarction: Secondary | ICD-10-CM | POA: Diagnosis not present

## 2023-03-22 DIAGNOSIS — I70219 Atherosclerosis of native arteries of extremities with intermittent claudication, unspecified extremity: Secondary | ICD-10-CM | POA: Diagnosis not present

## 2023-03-22 DIAGNOSIS — Z125 Encounter for screening for malignant neoplasm of prostate: Secondary | ICD-10-CM | POA: Diagnosis not present

## 2023-03-22 DIAGNOSIS — I10 Essential (primary) hypertension: Secondary | ICD-10-CM | POA: Diagnosis not present

## 2023-03-22 DIAGNOSIS — E782 Mixed hyperlipidemia: Secondary | ICD-10-CM | POA: Diagnosis not present

## 2023-03-22 DIAGNOSIS — I25118 Atherosclerotic heart disease of native coronary artery with other forms of angina pectoris: Secondary | ICD-10-CM | POA: Diagnosis not present

## 2023-03-22 DIAGNOSIS — I779 Disorder of arteries and arterioles, unspecified: Secondary | ICD-10-CM | POA: Diagnosis not present

## 2023-03-22 DIAGNOSIS — Z1331 Encounter for screening for depression: Secondary | ICD-10-CM | POA: Diagnosis not present

## 2023-03-22 DIAGNOSIS — Z0000 Encounter for general adult medical examination without abnormal findings: Secondary | ICD-10-CM | POA: Diagnosis not present

## 2023-03-22 DIAGNOSIS — F17210 Nicotine dependence, cigarettes, uncomplicated: Secondary | ICD-10-CM | POA: Diagnosis not present

## 2023-03-26 ENCOUNTER — Other Ambulatory Visit: Payer: Self-pay | Admitting: Internal Medicine

## 2023-03-26 DIAGNOSIS — Z0000 Encounter for general adult medical examination without abnormal findings: Secondary | ICD-10-CM

## 2023-03-26 DIAGNOSIS — F17210 Nicotine dependence, cigarettes, uncomplicated: Secondary | ICD-10-CM

## 2023-04-04 ENCOUNTER — Ambulatory Visit
Admission: RE | Admit: 2023-04-04 | Discharge: 2023-04-04 | Disposition: A | Discharge status: Home / Self Care | Payer: Medicare HMO | Source: Ambulatory Visit | Attending: Internal Medicine | Admitting: Internal Medicine

## 2023-04-04 DIAGNOSIS — Z0000 Encounter for general adult medical examination without abnormal findings: Secondary | ICD-10-CM | POA: Insufficient documentation

## 2023-04-04 DIAGNOSIS — F17210 Nicotine dependence, cigarettes, uncomplicated: Secondary | ICD-10-CM | POA: Insufficient documentation

## 2023-05-13 DIAGNOSIS — I10 Essential (primary) hypertension: Secondary | ICD-10-CM | POA: Diagnosis not present

## 2023-05-13 DIAGNOSIS — I25118 Atherosclerotic heart disease of native coronary artery with other forms of angina pectoris: Secondary | ICD-10-CM | POA: Diagnosis not present

## 2023-05-13 DIAGNOSIS — I779 Disorder of arteries and arterioles, unspecified: Secondary | ICD-10-CM | POA: Diagnosis not present

## 2023-05-13 DIAGNOSIS — Z720 Tobacco use: Secondary | ICD-10-CM | POA: Diagnosis not present

## 2023-05-13 DIAGNOSIS — E782 Mixed hyperlipidemia: Secondary | ICD-10-CM | POA: Diagnosis not present

## 2023-05-13 DIAGNOSIS — I252 Old myocardial infarction: Secondary | ICD-10-CM | POA: Diagnosis not present

## 2023-05-13 DIAGNOSIS — Z01818 Encounter for other preprocedural examination: Secondary | ICD-10-CM | POA: Diagnosis not present

## 2023-05-13 DIAGNOSIS — I739 Peripheral vascular disease, unspecified: Secondary | ICD-10-CM | POA: Diagnosis not present

## 2023-05-14 ENCOUNTER — Encounter: Payer: Self-pay | Admitting: Internal Medicine

## 2023-05-15 ENCOUNTER — Ambulatory Visit
Admission: RE | Admit: 2023-05-15 | Discharge: 2023-05-15 | Disposition: A | Discharge status: Home / Self Care | Payer: Medicare HMO | Attending: Internal Medicine | Admitting: Internal Medicine

## 2023-05-15 ENCOUNTER — Encounter: Payer: Self-pay | Admitting: Internal Medicine

## 2023-05-15 ENCOUNTER — Ambulatory Visit: Payer: Medicare HMO | Admitting: Certified Registered"

## 2023-05-15 ENCOUNTER — Other Ambulatory Visit: Payer: Self-pay

## 2023-05-15 ENCOUNTER — Encounter
Admission: RE | Disposition: A | Discharge status: Home / Self Care | Payer: Self-pay | Source: Home / Self Care | Attending: Internal Medicine

## 2023-05-15 DIAGNOSIS — Z7902 Long term (current) use of antithrombotics/antiplatelets: Secondary | ICD-10-CM | POA: Diagnosis not present

## 2023-05-15 DIAGNOSIS — Z1211 Encounter for screening for malignant neoplasm of colon: Secondary | ICD-10-CM | POA: Insufficient documentation

## 2023-05-15 DIAGNOSIS — K5730 Diverticulosis of large intestine without perforation or abscess without bleeding: Secondary | ICD-10-CM | POA: Diagnosis not present

## 2023-05-15 DIAGNOSIS — I2510 Atherosclerotic heart disease of native coronary artery without angina pectoris: Secondary | ICD-10-CM | POA: Insufficient documentation

## 2023-05-15 DIAGNOSIS — K635 Polyp of colon: Secondary | ICD-10-CM | POA: Diagnosis not present

## 2023-05-15 DIAGNOSIS — D123 Benign neoplasm of transverse colon: Secondary | ICD-10-CM | POA: Diagnosis not present

## 2023-05-15 DIAGNOSIS — I10 Essential (primary) hypertension: Secondary | ICD-10-CM | POA: Diagnosis not present

## 2023-05-15 DIAGNOSIS — Z955 Presence of coronary angioplasty implant and graft: Secondary | ICD-10-CM | POA: Diagnosis not present

## 2023-05-15 DIAGNOSIS — F17200 Nicotine dependence, unspecified, uncomplicated: Secondary | ICD-10-CM | POA: Diagnosis not present

## 2023-05-15 DIAGNOSIS — Z79899 Other long term (current) drug therapy: Secondary | ICD-10-CM | POA: Insufficient documentation

## 2023-05-15 DIAGNOSIS — J439 Emphysema, unspecified: Secondary | ICD-10-CM | POA: Insufficient documentation

## 2023-05-15 DIAGNOSIS — I739 Peripheral vascular disease, unspecified: Secondary | ICD-10-CM | POA: Insufficient documentation

## 2023-05-15 DIAGNOSIS — K5790 Diverticulosis of intestine, part unspecified, without perforation or abscess without bleeding: Secondary | ICD-10-CM | POA: Diagnosis not present

## 2023-05-15 DIAGNOSIS — I252 Old myocardial infarction: Secondary | ICD-10-CM | POA: Insufficient documentation

## 2023-05-15 HISTORY — PX: COLONOSCOPY: SHX5424

## 2023-05-15 HISTORY — PX: POLYPECTOMY: SHX5525

## 2023-05-15 SURGERY — COLONOSCOPY
Anesthesia: General

## 2023-05-15 MED ORDER — LIDOCAINE HCL (CARDIAC) PF 100 MG/5ML IV SOSY
PREFILLED_SYRINGE | INTRAVENOUS | Status: DC | PRN
  Administered 2023-05-15: 60 mg via INTRAVENOUS

## 2023-05-15 MED ORDER — PROPOFOL 500 MG/50ML IV EMUL
INTRAVENOUS | Status: DC | PRN
  Administered 2023-05-15: 125 ug/kg/min via INTRAVENOUS

## 2023-05-15 MED ORDER — PROPOFOL 10 MG/ML IV BOLUS
INTRAVENOUS | Status: AC
  Filled 2023-05-15: qty 20

## 2023-05-15 MED ORDER — PROPOFOL 10 MG/ML IV BOLUS
INTRAVENOUS | Status: DC | PRN
  Administered 2023-05-15: 10 mg via INTRAVENOUS
  Administered 2023-05-15: 80 mg via INTRAVENOUS

## 2023-05-15 MED ORDER — SODIUM CHLORIDE 0.9 % IV SOLN: INTRAVENOUS | Status: DC

## 2023-05-15 NOTE — H&P (Signed)
Outpatient short stay form Pre-procedure 05/15/2023 12:55 PM Tannen Vandezande K. Norma Fredrickson, M.D.  Primary Physician: Einar Crow, M.D.  Reason for visit:  Colon cancer screening  History of present illness:  Patient presents for colonoscopy for colon cancer screening. The patient denies complaints of abdominal pain, significant change in bowel habits, or rectal bleeding.      Current Facility-Administered Medications:    0.9 %  sodium chloride infusion, , Intravenous, Continuous, [place], Boykin Nearing, MD, Last Rate: 20 mL/hr at 05/15/23 1238, New Bag at 05/15/23 1238  Medications Prior to Admission  Medication Sig Dispense Refill Last Dose   aspirin EC 81 MG tablet Take 81 mg by mouth. Every 3rd day   05/10/2023   atorvastatin (LIPITOR) 20 MG tablet Take 80 mg by mouth daily.   05/14/2023   Glucosamine-Chondroitin 1500-1200 MG/30ML LIQD    05/14/2023   metoprolol succinate (TOPROL-XL) 50 MG 24 hr tablet Take 50 mg by mouth every evening.   05/15/2023 at 0930   Turmeric POWD Take 1 capsule by mouth daily. 400mg  / 90 of turmeric extract / ginger 30 mg    Past Week   vitamin B-12 (CYANOCOBALAMIN) 1000 MCG tablet Take 1,000 mcg by mouth in the morning and at bedtime.   Past Week   Zinc 50 MG TABS Take 50 mg by mouth every other day.   05/14/2023   CHANTIX STARTING MONTH PAK 0.5 MG X 11 & 1 MG X 42 tablet Take 0.5-1 mg by mouth 2 (two) times daily. (Patient not taking: Reported on 05/24/2022)      clopidogrel (PLAVIX) 75 MG tablet Take 75 mg by mouth every evening.    05/08/2023   gabapentin (NEURONTIN) 300 MG capsule TAKE 1 CAPSULE(300 MG) BY MOUTH AT BEDTIME (Patient not taking: Reported on 05/15/2023) 30 capsule 3 Not Taking   HYDROcodone-acetaminophen (NORCO) 5-325 MG tablet Take 1-2 tablets by mouth every 6 (six) hours as needed for moderate pain or severe pain. (Patient not taking: Reported on 08/12/2019) 40 tablet 0      Allergies  Allergen Reactions   Influenza A (H1n1) Monovalent Vaccine Cough     Cough, fever  20 yrs ago   Lanolin Rash    rash     Past Medical History:  Diagnosis Date   Anemia 2005   was on b12 shots briefly and supplements   Arthritis    Cardiovascular disease    Hearing aid worn    bilateral   High blood pressure    Myocardial infarction (HCC) 2015   1 stent   Peripheral vascular disease (HCC)     Review of systems:  Otherwise negative.    Physical Exam  Gen: Alert, oriented. Appears stated age.  HEENT: [place]/AT. PERRLA. Lungs: CTA, no wheezes. CV: RR nl S1, S2. Abd: soft, benign, no masses. BS+ Ext: No edema. Pulses 2+    Planned procedures: Proceed with colonoscopy. The patient understands the nature of the planned procedure, indications, risks, alternatives and potential complications including but not limited to bleeding, infection, perforation, damage to internal organs and possible oversedation/side effects from anesthesia. The patient agrees and gives consent to proceed.  Please refer to procedure notes for findings, recommendations and patient disposition/instructions.     Mikeria Valin K. Norma Fredrickson, M.D. Gastroenterology 05/15/2023  12:55 PM

## 2023-05-15 NOTE — Interval H&P Note (Signed)
History and Physical Interval Note:  05/15/2023 12:55 PM  Gavin Mendez  has presented today for surgery, with the diagnosis of V76.51 (ICD-9-CM) - Z12.11 (ICD-10-CM) - Colon cancer screening.  The various methods of treatment have been discussed with the patient and family. After consideration of risks, benefits and other options for treatment, the patient has consented to  Procedure(s): COLONOSCOPY (N/A) as a surgical intervention.  The patient's history has been reviewed, patient examined, no change in status, stable for surgery.  I have reviewed the patient's chart and labs.  Questions were answered to the patient's satisfaction.     [place], [place]

## 2023-05-15 NOTE — Op Note (Signed)
[facility] Gastroenterology Patient Name: Gavin Mendez Procedure Date: 05/15/2023 12:50 PM MRN: [id number] Account #: [id number] Date of Birth: 1956-06-26 Admit Type: Outpatient Age: 67 Room: [facility] ENDO ROOM 4 Gender: Male Note Status: Finalized Instrument Name: Colonoscope [id number] Procedure:             Colonoscopy Indications:           Screening for colorectal malignant neoplasm Providers:             Royce Macadamia K. Norma Fredrickson MD, MD Referring MD:          Marya Amsler. Dareen Piano MD, MD (Referring MD) Medicines:             Propofol per Anesthesia Complications:         No immediate complications. Procedure:             Pre-Anesthesia Assessment:                        - The risks and benefits of the procedure and the                         sedation options and risks were discussed with the                         patient. All questions were answered and informed                         consent was obtained.                        - Patient identification and proposed procedure were                         verified prior to the procedure by the nurse. The                         procedure was verified in the procedure room.                        - ASA Grade Assessment: III - A patient with severe                         systemic disease.                        - After reviewing the risks and benefits, the patient                         was deemed in satisfactory condition to undergo the                         procedure.                        After obtaining informed consent, the colonoscope was                         passed under direct vision. Throughout the procedure,  the patient's blood pressure, pulse, and oxygen                         saturations were monitored continuously. The                         Colonoscope was introduced through the anus and                         advanced to the the cecum, identified by appendiceal                          orifice and ileocecal valve. The colonoscopy was                         performed without difficulty. The patient tolerated                         the procedure well. The quality of the bowel                         preparation was good. The ileocecal valve, appendiceal                         orifice, and rectum were photographed. Findings:      The perianal and digital rectal examinations were normal.      A 10 mm polyp was found in the transverse colon. The polyp was       semi-pedunculated. The polyp was removed with a hot snare. Resection and       retrieval were complete.      Many small-mouthed diverticula were found in the sigmoid colon.      The exam was otherwise without abnormality on direct and retroflexion       views. Impression:            - One 10 mm polyp in the transverse colon, removed                         with a hot snare. Resected and retrieved.                        - Diverticulosis in the sigmoid colon.                        - The examination was otherwise normal on direct and                         retroflexion views. Recommendation:        - Patient has a contact number available for                         emergencies. The signs and symptoms of potential                         delayed complications were discussed with the patient.                         Return to normal activities tomorrow. Written  discharge instructions were provided to the patient.                        - Resume previous diet.                        - Continue present medications.                        - Repeat colonoscopy is recommended for surveillance.                         The colonoscopy date will be determined after                         pathology results from today's exam become available                         for review.                        - Return to GI office PRN.                        - The findings and recommendations  were discussed with                         the patient. Procedure Code(s):     --- Professional ---                        [phone], Colonoscopy, flexible; with removal of                         tumor(s), polyp(s), or other lesion(s) by snare                         technique Diagnosis Code(s):     --- Professional ---                        K57.30, Diverticulosis of large intestine without                         perforation or abscess without bleeding                        D12.3, Benign neoplasm of transverse colon (hepatic                         flexure or splenic flexure)                        Z12.11, Encounter for screening for malignant neoplasm                         of colon CPT copyright 2022 [employer]. All rights reserved. The codes documented in this report are preliminary and upon coder review may  be revised to meet current compliance requirements. Stanton Kidney MD, MD 05/15/2023 1:18:52 PM This report has been signed electronically. Number of Addenda: 0 Note Initiated On: 05/15/2023 12:50 PM Scope Withdrawal Time: 0 hours  9 minutes 6 seconds  Total Procedure Duration: 0 hours 14 minutes 1 second  Estimated Blood Loss:  Estimated blood loss: none.      [facility]

## 2023-05-15 NOTE — Anesthesia Preprocedure Evaluation (Signed)
Anesthesia Evaluation  Patient identified by MRN, date of birth, ID band Patient awake    Reviewed: Allergy & Precautions, H&P , NPO status , Patient's Chart, lab work & pertinent test results, reviewed documented beta blocker date and time   Airway Mallampati: II  TM Distance: >3 FB Neck ROM: full    Dental no notable dental hx. (+)    Pulmonary Current Smoker and Patient abstained from smoking. CT with emphesema   Pulmonary exam normal        Cardiovascular Exercise Tolerance: Good hypertension, Pt. on medications and Pt. on home beta blockers + CAD, + Past MI (2015), + Cardiac Stents and + Peripheral Vascular Disease  Normal cardiovascular exam     Neuro/Psych negative neurological ROS  negative psych ROS   GI/Hepatic negative GI ROS, Neg liver ROS,,,  Endo/Other  negative endocrine ROS    Renal/GU negative Renal ROS  negative genitourinary   Musculoskeletal   Abdominal Normal abdominal exam  (+)   Peds  Hematology negative hematology ROS (+)   Anesthesia Other Findings Past Medical History: 2005: Anemia     Comment:  was on b12 shots briefly and supplements No date: Arthritis No date: Cardiovascular disease No date: Hearing aid worn     Comment:  bilateral No date: High blood pressure 2015: Myocardial infarction [facility])     Comment:  1 stent No date: Peripheral vascular disease (HCC)  Past Surgical History: 06/04/2022: CATARACT EXTRACTION W/PHACO; Left     Comment:  Procedure: CATARACT EXTRACTION PHACO AND INTRAOCULAR               LENS PLACEMENT (IOC) LEFT;  Surgeon: Nevada Crane,               MD;  Location: [facility];  Service:               Ophthalmology;  Laterality: Left;  6.44 00:46.1  06/18/2022: CATARACT EXTRACTION W/PHACO; Right     Comment:  Procedure: CATARACT EXTRACTION PHACO AND INTRAOCULAR               LENS PLACEMENT (IOC) RIGHT;  Surgeon: Nevada Crane,               MD;  Location: [facility];  Service:               Ophthalmology;  Laterality: Right;  2.91 00:27.5 2015: CORONARY ANGIOPLASTY WITH STENT PLACEMENT     Comment:  1 stent 01/23/2019: LOWER EXTREMITY ANGIOGRAPHY; Left     Comment:  Procedure: LOWER EXTREMITY ANGIOGRAPHY;  Surgeon:               Renford Dills, MD;  Location: [facility] INVASIVE CV LAB;               Service: Cardiovascular;  Laterality: Left; 01/28/2019: LOWER EXTREMITY ANGIOGRAPHY; Left     Comment:  Procedure: LOWER EXTREMITY ANGIOGRAPHY;  Surgeon:               Renford Dills, MD;  Location: [facility] INVASIVE CV LAB;               Service: Cardiovascular;  Laterality: Left;  BMI    Body Mass Index: 22.68 kg/m      Reproductive/Obstetrics negative OB ROS                             Anesthesia Physical Anesthesia Plan  ASA: 2  Anesthesia Plan: General   Post-op Pain Management:    Induction: Intravenous  PONV Risk Score and Plan: Propofol infusion and TIVA  Airway Management Planned: Natural Airway  Additional Equipment:   Intra-op Plan:   Post-operative Plan:   Informed Consent: I have reviewed the patients History and Physical, chart, labs and discussed the procedure including the risks, benefits and alternatives for the proposed anesthesia with the patient or authorized representative who has indicated his/her understanding and acceptance.     Dental Advisory Given  Plan Discussed with: CRNA and Surgeon  Anesthesia Plan Comments:         Anesthesia Quick Evaluation

## 2023-05-15 NOTE — Transfer of Care (Signed)
Immediate Anesthesia Transfer of Care Note  Patient: Gavin Mendez  Procedure(s) Performed: COLONOSCOPY  Patient Location: PACU  Anesthesia Type:General  Level of Consciousness: sedated  Airway & Oxygen Therapy: Patient Spontanous Breathing  Post-op Assessment: Report given to RN and Post -op Vital signs reviewed and stable  Post vital signs: Reviewed and stable  Last Vitals:  Vitals Value Taken Time  BP 101/54 05/15/23 1322  Temp 36.5 C 05/15/23 1320  Pulse 60 05/15/23 1322  Resp 13 05/15/23 1322  SpO2 97 % 05/15/23 1322  Vitals shown include unvalidated device data.  Last Pain:  Vitals:   05/15/23 1320  TempSrc: Temporal  PainSc: Asleep         Complications: No notable events documented.

## 2023-05-16 ENCOUNTER — Encounter: Payer: Self-pay | Admitting: Internal Medicine

## 2023-05-16 NOTE — Anesthesia Postprocedure Evaluation (Signed)
Anesthesia Post Note  Patient: Bobbyjoe Pabst  Procedure(s) Performed: COLONOSCOPY POLYPECTOMY  Patient location during evaluation: Endoscopy Anesthesia Type: General Level of consciousness: awake and alert Pain management: pain level controlled Vital Signs Assessment: post-procedure vital signs reviewed and stable Respiratory status: spontaneous breathing, nonlabored ventilation and respiratory function stable Cardiovascular status: blood pressure returned to baseline and stable Postop Assessment: no apparent nausea or vomiting Anesthetic complications: no   No notable events documented.   Last Vitals:  Vitals:   05/15/23 1320 05/15/23 1340  BP: (!) 101/54 122/70  Pulse: 61 (!) 58  Resp: 11 16  Temp: 36.5 C   SpO2: 97% 97%    Last Pain:  Vitals:   05/15/23 1340  TempSrc:   PainSc: 0-No pain                 Foye Deer

## 2023-08-21 DIAGNOSIS — F17210 Nicotine dependence, cigarettes, uncomplicated: Secondary | ICD-10-CM | POA: Diagnosis not present

## 2023-08-21 DIAGNOSIS — Z887 Allergy status to serum and vaccine status: Secondary | ICD-10-CM | POA: Diagnosis not present

## 2023-08-21 DIAGNOSIS — S199XXA Unspecified injury of neck, initial encounter: Secondary | ICD-10-CM | POA: Diagnosis not present

## 2023-08-21 DIAGNOSIS — R079 Chest pain, unspecified: Secondary | ICD-10-CM | POA: Diagnosis not present

## 2023-08-21 DIAGNOSIS — R202 Paresthesia of skin: Secondary | ICD-10-CM | POA: Diagnosis not present

## 2023-08-21 DIAGNOSIS — M542 Cervicalgia: Secondary | ICD-10-CM | POA: Diagnosis not present

## 2023-08-21 DIAGNOSIS — R519 Headache, unspecified: Secondary | ICD-10-CM | POA: Diagnosis not present

## 2023-08-21 DIAGNOSIS — M436 Torticollis: Secondary | ICD-10-CM | POA: Diagnosis not present

## 2023-08-21 DIAGNOSIS — R0600 Dyspnea, unspecified: Secondary | ICD-10-CM | POA: Diagnosis not present

## 2023-08-21 DIAGNOSIS — R200 Anesthesia of skin: Secondary | ICD-10-CM | POA: Diagnosis not present

## 2023-10-30 DIAGNOSIS — H524 Presbyopia: Secondary | ICD-10-CM | POA: Diagnosis not present

## 2023-10-30 DIAGNOSIS — H5203 Hypermetropia, bilateral: Secondary | ICD-10-CM | POA: Diagnosis not present

## 2023-10-30 DIAGNOSIS — H52209 Unspecified astigmatism, unspecified eye: Secondary | ICD-10-CM | POA: Diagnosis not present

## 2023-10-30 DIAGNOSIS — H5213 Myopia, bilateral: Secondary | ICD-10-CM | POA: Diagnosis not present

## 2024-03-02 DIAGNOSIS — I252 Old myocardial infarction: Secondary | ICD-10-CM | POA: Diagnosis not present

## 2024-03-02 DIAGNOSIS — Z01818 Encounter for other preprocedural examination: Secondary | ICD-10-CM | POA: Diagnosis not present

## 2024-03-02 DIAGNOSIS — I10 Essential (primary) hypertension: Secondary | ICD-10-CM | POA: Diagnosis not present

## 2024-03-02 DIAGNOSIS — E782 Mixed hyperlipidemia: Secondary | ICD-10-CM | POA: Diagnosis not present

## 2024-03-02 DIAGNOSIS — I739 Peripheral vascular disease, unspecified: Secondary | ICD-10-CM | POA: Diagnosis not present

## 2024-03-02 DIAGNOSIS — Z720 Tobacco use: Secondary | ICD-10-CM | POA: Diagnosis not present

## 2024-03-02 DIAGNOSIS — I25118 Atherosclerotic heart disease of native coronary artery with other forms of angina pectoris: Secondary | ICD-10-CM | POA: Diagnosis not present

## 2024-03-02 DIAGNOSIS — I779 Disorder of arteries and arterioles, unspecified: Secondary | ICD-10-CM | POA: Diagnosis not present

## 2024-03-23 ENCOUNTER — Other Ambulatory Visit: Payer: Self-pay | Admitting: Internal Medicine

## 2024-03-23 DIAGNOSIS — I719 Aortic aneurysm of unspecified site, without rupture: Secondary | ICD-10-CM

## 2024-03-23 DIAGNOSIS — Z720 Tobacco use: Secondary | ICD-10-CM

## 2024-03-27 ENCOUNTER — Other Ambulatory Visit: Payer: Self-pay | Admitting: Internal Medicine

## 2024-03-27 DIAGNOSIS — I719 Aortic aneurysm of unspecified site, without rupture: Secondary | ICD-10-CM

## 2024-03-27 DIAGNOSIS — Z720 Tobacco use: Secondary | ICD-10-CM

## 2024-03-30 ENCOUNTER — Other Ambulatory Visit: Payer: Self-pay | Admitting: Internal Medicine

## 2024-03-30 DIAGNOSIS — Z720 Tobacco use: Secondary | ICD-10-CM

## 2024-03-30 DIAGNOSIS — F17210 Nicotine dependence, cigarettes, uncomplicated: Secondary | ICD-10-CM

## 2024-04-07 DIAGNOSIS — I25118 Atherosclerotic heart disease of native coronary artery with other forms of angina pectoris: Secondary | ICD-10-CM | POA: Diagnosis not present

## 2024-04-08 ENCOUNTER — Ambulatory Visit
Admission: RE | Admit: 2024-04-08 | Discharge: 2024-04-08 | Disposition: A | Discharge status: Home / Self Care | Source: Ambulatory Visit | Attending: Internal Medicine | Admitting: Internal Medicine

## 2024-04-08 DIAGNOSIS — Z720 Tobacco use: Secondary | ICD-10-CM | POA: Insufficient documentation

## 2024-04-08 DIAGNOSIS — F17210 Nicotine dependence, cigarettes, uncomplicated: Secondary | ICD-10-CM | POA: Diagnosis not present

## 2024-04-08 DIAGNOSIS — N281 Cyst of kidney, acquired: Secondary | ICD-10-CM | POA: Diagnosis not present

## 2024-04-08 DIAGNOSIS — I719 Aortic aneurysm of unspecified site, without rupture: Secondary | ICD-10-CM | POA: Diagnosis not present

## 2024-04-08 DIAGNOSIS — K7689 Other specified diseases of liver: Secondary | ICD-10-CM | POA: Diagnosis not present

## 2024-04-08 MED ORDER — IOHEXOL 350 MG/ML SOLN
100.0000 mL | Freq: Once | INTRAVENOUS | Status: AC | PRN
  Administered 2024-04-08: 100 mL via INTRAVENOUS

## 2024-05-01 DIAGNOSIS — I25118 Atherosclerotic heart disease of native coronary artery with other forms of angina pectoris: Secondary | ICD-10-CM | POA: Diagnosis not present

## 2024-05-13 DIAGNOSIS — I779 Disorder of arteries and arterioles, unspecified: Secondary | ICD-10-CM | POA: Diagnosis not present

## 2024-05-13 DIAGNOSIS — I10 Essential (primary) hypertension: Secondary | ICD-10-CM | POA: Diagnosis not present

## 2024-05-13 DIAGNOSIS — Z720 Tobacco use: Secondary | ICD-10-CM | POA: Diagnosis not present

## 2024-05-13 DIAGNOSIS — I739 Peripheral vascular disease, unspecified: Secondary | ICD-10-CM | POA: Diagnosis not present

## 2024-05-13 DIAGNOSIS — I25118 Atherosclerotic heart disease of native coronary artery with other forms of angina pectoris: Secondary | ICD-10-CM | POA: Diagnosis not present

## 2024-05-13 DIAGNOSIS — I6529 Occlusion and stenosis of unspecified carotid artery: Secondary | ICD-10-CM | POA: Diagnosis not present

## 2024-05-13 DIAGNOSIS — I252 Old myocardial infarction: Secondary | ICD-10-CM | POA: Diagnosis not present

## 2024-05-13 DIAGNOSIS — E782 Mixed hyperlipidemia: Secondary | ICD-10-CM | POA: Diagnosis not present

## 2024-05-18 ENCOUNTER — Other Ambulatory Visit: Payer: Self-pay | Admitting: Internal Medicine

## 2024-05-18 DIAGNOSIS — I25118 Atherosclerotic heart disease of native coronary artery with other forms of angina pectoris: Secondary | ICD-10-CM

## 2024-05-18 DIAGNOSIS — I6529 Occlusion and stenosis of unspecified carotid artery: Secondary | ICD-10-CM

## 2024-05-18 DIAGNOSIS — E782 Mixed hyperlipidemia: Secondary | ICD-10-CM

## 2024-05-20 ENCOUNTER — Ambulatory Visit
Admission: RE | Admit: 2024-05-20 | Discharge: 2024-05-20 | Disposition: A | Discharge status: Home / Self Care | Source: Ambulatory Visit | Attending: Internal Medicine | Admitting: Internal Medicine

## 2024-05-20 DIAGNOSIS — I6529 Occlusion and stenosis of unspecified carotid artery: Secondary | ICD-10-CM | POA: Insufficient documentation

## 2024-05-20 DIAGNOSIS — I672 Cerebral atherosclerosis: Secondary | ICD-10-CM | POA: Diagnosis not present

## 2024-05-20 DIAGNOSIS — I25118 Atherosclerotic heart disease of native coronary artery with other forms of angina pectoris: Secondary | ICD-10-CM | POA: Insufficient documentation

## 2024-05-20 DIAGNOSIS — E041 Nontoxic single thyroid nodule: Secondary | ICD-10-CM | POA: Diagnosis not present

## 2024-05-20 DIAGNOSIS — I6523 Occlusion and stenosis of bilateral carotid arteries: Secondary | ICD-10-CM | POA: Diagnosis not present

## 2024-05-20 DIAGNOSIS — E782 Mixed hyperlipidemia: Secondary | ICD-10-CM | POA: Insufficient documentation

## 2024-05-20 DIAGNOSIS — I6503 Occlusion and stenosis of bilateral vertebral arteries: Secondary | ICD-10-CM | POA: Diagnosis not present

## 2024-05-20 MED ORDER — IOHEXOL 350 MG/ML SOLN
75.0000 mL | Freq: Once | INTRAVENOUS | Status: AC | PRN
  Administered 2024-05-20: 75 mL via INTRAVENOUS

## 2024-06-14 DIAGNOSIS — I6529 Occlusion and stenosis of unspecified carotid artery: Secondary | ICD-10-CM | POA: Insufficient documentation

## 2024-06-14 NOTE — Progress Notes (Deleted)
 MRN : [id number]  Gavin Mendez is a 68 y.o. (04-21-1956) male who presents with chief complaint of check carotid arteries.  History of Present Illness:   The patient returns to the office for evaluation of carotid stenosis. The carotid stenosis followed by CT angiogram dated 05/20/2024.  I have personally reviewed the scan and concur with the report there is approximately 50% stenosis of the proximal internal carotid arteries bilaterally.  The patient denies amaurosis fugax. There is no recent history of TIA symptoms or focal motor deficits. There is no prior documented CVA.  The patient is taking enteric-coated aspirin  81 mg daily.  There is no history of migraine headaches. There is no history of seizures.  He is also followed for atherosclerosis of the lower extremities status post angiogram with intervention on 01/23/2019 with a return on 01/28/2019.    Procedure 01/23/2019:  Percutaneous transluminal angioplasty and stent placement right external iliac artery 2.   Percutaneous transluminal angioplasty and stent placement left external iliac artery 3.     Crosser atherectomy with percutaneous transluminal angioplasty left superficial femoral artery unsuccessful.   Procedure 01/28/2019: 1.   Percutaneous transluminal angioplasty and stent placement left superficial femoral and popliteal arteries to 6 mm 2.   Percutaneous transluminal angioplasty left anterior tibial   The patient notes his walking is stable and he does not have significant claudication.   No new ulcers or wounds have occurred since the last visit.     There have been no significant changes to the patient's overall health care.   The patient denies amaurosis fugax or recent TIA symptoms. There are no recent neurological changes noted. The patient denies history of DVT, PE or superficial thrombophlebitis. The patient denies recent episodes of angina or shortness of breath.    ABI's Rt=1.02 and  Lt=0.98  (previous ABI's Rt=1.16 and Lt=1.23)   Previous duplex US  of the left lower extremity arterial system shows patent arterial system with a widely patent stent  No outpatient medications have been marked as taking for the 06/15/24 encounter (Appointment) with Jama, Cordella MATSU, MD.    Past Medical History:  Diagnosis Date   Anemia 2005   was on b12 shots briefly and supplements   Arthritis    Cardiovascular disease    Hearing aid worn    bilateral   High blood pressure    Myocardial infarction [facility]) 2015   1 stent   Peripheral vascular disease [facility])     Past Surgical History:  Procedure Laterality Date   CATARACT EXTRACTION W/PHACO Left 06/04/2022   Procedure: CATARACT EXTRACTION PHACO AND INTRAOCULAR LENS PLACEMENT (IOC) LEFT;  Surgeon: Myrna Adine Anes, MD;  Location: [facility];  Service: Ophthalmology;  Laterality: Left;  6.44 00:46.1    CATARACT EXTRACTION W/PHACO Right 06/18/2022   Procedure: CATARACT EXTRACTION PHACO AND INTRAOCULAR LENS PLACEMENT (IOC) RIGHT;  Surgeon: Myrna Adine Anes, MD;  Location: [facility];  Service: Ophthalmology;  Laterality: Right;  2.91 00:27.5   COLONOSCOPY N/A 05/15/2023   Procedure: COLONOSCOPY;  Surgeon: Toledo, Ladell POUR, MD;  Location: [facility] ENDOSCOPY;  Service: Gastroenterology;  Laterality: N/A;   CORONARY ANGIOPLASTY WITH STENT PLACEMENT  2015   1 stent   LOWER EXTREMITY ANGIOGRAPHY Left 01/23/2019   Procedure: LOWER EXTREMITY ANGIOGRAPHY;  Surgeon: Jama Cordella MATSU, MD;  Location: [facility] INVAS[facility] CV LAB;  Service: Cardiovascular;  Laterality: Left;   LOWER EXTREMITY ANGIOGRAPHY Left 01/28/2019   Procedure: LOWER EXTREMITY ANGIOGRAPHY;  Surgeon: Jama Cordella MATSU, MD;  Location: [facility] CV [facility];  Service: Cardiovascular;  Laterality: Left;   POLYPECTOMY  05/15/2023   Procedure: POLYPECTOMY;  Surgeon: Toledo, Ladell POUR, MD;  Location: [facility] ENDOSCOPY;  Service: Gastroenterology;;    Social History Social  History   Tobacco Use   Smoking status: Every Day    Current packs/day: 1.00    Average packs/day: 1 pack/day for 45.0 years (45.0 ttl pk-yrs)    Types: Cigarettes   Smokeless tobacco: Never  Vaping Use   Vaping status: Never Used  Substance Use Topics   Alcohol use: Never   Drug use: Never    Family History Family History  Problem Relation Age of Onset   Diabetes Mother    Obesity Mother    Heart attack Father    Stroke Father     Allergies  Allergen Reactions   Influenza A (H1n1) Monovalent Vaccine Cough    Cough, fever  20 yrs ago   Lanolin Rash    rash     REVIEW OF SYSTEMS (Negative unless checked)  Constitutional: [] Weight loss  [] Fever  [] Chills Cardiac: [] Chest pain   [] Chest pressure   [] Palpitations   [] Shortness of breath when laying flat   [] Shortness of breath with exertion. Vascular:  [x] Pain in legs with walking   [] Pain in legs at rest  [] History of DVT   [] Phlebitis   [] Swelling in legs   [] Varicose veins   [] Non-healing ulcers Pulmonary:   [] Uses home oxygen   [] Productive cough   [] Hemoptysis   [] Wheeze  [] COPD   [] Asthma Neurologic:  [] Dizziness   [] Seizures   [] History of stroke   [] History of TIA  [] Aphasia   [] Vissual changes   [] Weakness or numbness in arm   [] Weakness or numbness in leg Musculoskeletal:   [] Joint swelling   [] Joint pain   [] Low back pain Hematologic:  [] Easy bruising  [] Easy bleeding   [] Hypercoagulable state   [] Anemic Gastrointestinal:  [] Diarrhea   [] Vomiting  [] Gastroesophageal reflux/heartburn   [] Difficulty swallowing. Genitourinary:  [] Chronic kidney disease   [] Difficult urination  [] Frequent urination   [] Blood in urine Skin:  [] Rashes   [] Ulcers  Psychological:  [] History of anxiety   []  History of major depression.  Physical Examination  There were no vitals filed for this visit. There is no height or weight on file to calculate BMI. Gen: WD/WN, NAD Head: [place]/AT, No temporalis wasting.  Ear/Nose/Throat: Hearing  grossly intact, nares w/o erythema or drainage Eyes: PER, EOMI, sclera nonicteric.  Neck: Supple, no masses.  No bruit or JVD.  Pulmonary:  Good air movement, no audible wheezing, no use of accessory muscles.  Cardiac: RRR, normal S1, S2, no Murmurs. Vascular:  carotid bruit noted Vessel Right Left  Radial Palpable Palpable  Carotid  Palpable  Palpable  Subclav  Palpable Palpable  Gastrointestinal: soft, non-distended. No guarding/no peritoneal signs.  Musculoskeletal: M/S 5/5 throughout.  No visible deformity.  Neurologic: CN 2-12 intact. Pain and light touch intact in extremities.  Symmetrical.  Speech is fluent. Motor exam as listed above. Psychiatric: Judgment intact, Mood & affect appropriate for pt's clinical situation. Dermatologic: No rashes or ulcers noted.  No changes consistent with cellulitis.   CBC Lab Results  Component Value Date   WBC 12.0 (H) 05/20/2014   HGB 13.9 05/20/2014   HCT 41.4 05/20/2014   MCV 96 05/20/2014   PLT  213 05/20/2014    BMET    Component Value Date/Time   NA 137 05/21/2014 0405   K 3.7 05/21/2014 0405   CL 105 05/21/2014 0405   CO2 22 05/21/2014 0405   GLUCOSE 106 (H) 05/21/2014 0405   BUN 15 01/22/2019 1358   BUN 7 05/21/2014 0405   CREATININE 0.67 01/22/2019 1358   CREATININE 0.68 05/21/2014 0405   CALCIUM 8.7 05/21/2014 0405   GFRNONAA >60 01/22/2019 1358   GFRNONAA >60 05/21/2014 0405   GFRAA >60 01/22/2019 1358   GFRAA >60 05/21/2014 0405   CrCl cannot be calculated (Patient's most recent lab result is older than the maximum 21 days allowed.).  COAG No results found for: INR, PROTIME  Radiology CT ANGIO HEAD NECK W WO CM Result Date: 05/24/2024 CLINICAL DATA:  Coronary artery disease, hyperlipidemia, evaluation for occlusion or stenosis of the carotid arteries. EXAM: CT ANGIOGRAPHY HEAD AND NECK WITH AND WITHOUT CONTRAST TECHNIQUE: Multidetector CT imaging of the head and neck was performed using the standard protocol  during bolus administration of intravenous contrast. Multiplanar CT image reconstructions and MIPs were obtained to evaluate the vascular anatomy. Carotid stenosis measurements (when applicable) are obtained utilizing NASCET criteria, using the distal internal carotid diameter as the denominator. RADIATION DOSE REDUCTION: This exam was performed according to the departmental dose-optimization program which includes automated exposure control, adjustment of the mA and/or kV according to patient size and/or use of iterative reconstruction technique. CONTRAST:  75mL OMNIPAQUE  IOHEXOL  350 MG/ML SOLN COMPARISON:  Carotid ultrasound 12/08/2020. FINDINGS: CT HEAD FINDINGS Brain: No acute intracranial hemorrhage. No CT evidence of acute infarct. Nonspecific hypoattenuation in the periventricular and subcortical white matter favored to reflect chronic microvascular ischemic changes. No edema, mass effect, or midline shift. The basilar cisterns are patent. Ventricles: The ventricles are normal. Vascular: Atherosclerotic calcifications of the carotid siphons. No hyperdense vessel. Skull: No acute or aggressive finding. Orbits: Bilateral lens replacement. Sinuses: Mild mucosal thickening in the right maxillary sinus. Other: Mastoid air cells are clear. CTA NECK FINDINGS Aortic arch: Standard configuration of the aortic arch. Imaged portion shows no evidence of aneurysm or dissection. Mild atherosclerosis of the visualized aortic arch. No significant stenosis of the major arch vessel origins. Pulmonary arteries: As permitted by contrast timing, there are no filling defects in the visualized pulmonary arteries. Subclavian arteries: Patent bilaterally. Mild atherosclerosis at the left subclavian artery origin. No significant stenosis. Right carotid system: Vessel is patent from the origin to the skull base. Bulky calcified atherosclerosis at the carotid bifurcation without hemodynamically significant stenosis. Additional focal  atherosclerosis along the proximal cervical ICA without hemodynamically significant stenosis. Stenosis noted at the origin of the external carotid artery. Left carotid system: Patent from the origin to the skull base. Atherosclerosis at the origin of the common carotid artery without significant stenosis. Bulky calcified atherosclerosis at the carotid bifurcation without hemodynamically significant stenosis. Vertebral arteries: Codominant. The vertebral arteries are patent from the origins to the vertebrobasilar confluence. Severe ostial stenosis of the right vertebral artery. Additional moderate ostial stenosis of the left vertebral artery. Skeleton: No acute findings. Degenerative changes in the cervical spine. Multiple periapical lucencies. Dental caries. Other neck: The visualized airway is patent. No cervical lymphadenopathy. 1.9 cm nodule along the posterior aspect of the left thyroid lobe. Upper chest: Paraseptal and centrilobular emphysema in the lung apices. Review of the MIP images confirms the above findings CTA HEAD FINDINGS ANTERIOR CIRCULATION: Intracranial internal carotid arteries are patent bilaterally. Atherosclerosis of the carotid siphons.  Moderate stenosis of the left cavernous ICA. Mild stenosis of the right cavernous ICA. MCAs: The middle cerebral arteries are patent bilaterally. ACAs: The anterior cerebral arteries are patent bilaterally. POSTERIOR CIRCULATION: No significant stenosis, proximal occlusion, aneurysm, or vascular malformation. PCAs: The posterior cerebral arteries are patent bilaterally. Pcomm: The posterior communicating arteries are visualized bilaterally. SCAs: The superior cerebellar arteries are patent bilaterally. Basilar artery: Patent AICAs: Visualized on the right. PICAs: Visualized on the left. Vertebral arteries: The intracranial vertebral arteries are patent. Venous sinuses: As permitted by contrast timing, patent. Anatomic variants: None Review of the MIP images  confirms the above findings IMPRESSION: Bulky calcified atherosclerosis at the bilateral carotid bifurcations. There is no hemodynamically significant stenosis (no stenosis greater than 50%) by NASCET criteria. Severe ostial stenosis of the right vertebral artery. Moderate ostial stenosis of the left vertebral artery. Atherosclerosis of the carotid siphons. Moderate stenosis of the left cavernous ICA. No CT evidence of acute intracranial abnormality. 1.9 cm nodule in the posterior left thyroid lobe. Recommend correlation with thyroid ultrasound for further evaluation. Aortic Atherosclerosis (ICD10-I70.0) and Emphysema (ICD10-J43.9). Electronically Signed   By: Donnice Mania M.D.   On: 05/24/2024 22:20     Assessment/Plan There are no diagnoses linked to this encounter.   Cordella Shawl, MD  06/14/2024 4:19 PM

## 2024-06-15 ENCOUNTER — Ambulatory Visit (INDEPENDENT_AMBULATORY_CARE_PROVIDER_SITE_OTHER): Admitting: Vascular Surgery

## 2024-06-15 DIAGNOSIS — I25118 Atherosclerotic heart disease of native coronary artery with other forms of angina pectoris: Secondary | ICD-10-CM | POA: Diagnosis not present

## 2024-06-15 DIAGNOSIS — I779 Disorder of arteries and arterioles, unspecified: Secondary | ICD-10-CM | POA: Diagnosis not present

## 2024-06-15 DIAGNOSIS — I10 Essential (primary) hypertension: Secondary | ICD-10-CM

## 2024-06-15 DIAGNOSIS — I70219 Atherosclerosis of native arteries of extremities with intermittent claudication, unspecified extremity: Secondary | ICD-10-CM

## 2024-06-15 DIAGNOSIS — Z125 Encounter for screening for malignant neoplasm of prostate: Secondary | ICD-10-CM | POA: Diagnosis not present

## 2024-06-15 DIAGNOSIS — I6523 Occlusion and stenosis of bilateral carotid arteries: Secondary | ICD-10-CM

## 2024-06-15 DIAGNOSIS — R799 Abnormal finding of blood chemistry, unspecified: Secondary | ICD-10-CM | POA: Diagnosis not present

## 2024-06-15 DIAGNOSIS — E782 Mixed hyperlipidemia: Secondary | ICD-10-CM

## 2024-06-22 DIAGNOSIS — Z1331 Encounter for screening for depression: Secondary | ICD-10-CM | POA: Diagnosis not present

## 2024-06-22 DIAGNOSIS — R7303 Prediabetes: Secondary | ICD-10-CM | POA: Diagnosis not present

## 2024-06-22 DIAGNOSIS — E041 Nontoxic single thyroid nodule: Secondary | ICD-10-CM | POA: Diagnosis not present

## 2024-06-22 DIAGNOSIS — Z0000 Encounter for general adult medical examination without abnormal findings: Secondary | ICD-10-CM | POA: Diagnosis not present

## 2024-06-22 DIAGNOSIS — Z720 Tobacco use: Secondary | ICD-10-CM | POA: Diagnosis not present

## 2024-06-22 DIAGNOSIS — I779 Disorder of arteries and arterioles, unspecified: Secondary | ICD-10-CM | POA: Diagnosis not present

## 2024-06-22 DIAGNOSIS — I10 Essential (primary) hypertension: Secondary | ICD-10-CM | POA: Diagnosis not present

## 2024-06-22 DIAGNOSIS — I2510 Atherosclerotic heart disease of native coronary artery without angina pectoris: Secondary | ICD-10-CM | POA: Diagnosis not present

## 2024-07-13 DIAGNOSIS — E041 Nontoxic single thyroid nodule: Secondary | ICD-10-CM | POA: Diagnosis not present

## 2024-07-16 DIAGNOSIS — E042 Nontoxic multinodular goiter: Secondary | ICD-10-CM | POA: Diagnosis not present

## 2024-07-16 DIAGNOSIS — Z1331 Encounter for screening for depression: Secondary | ICD-10-CM | POA: Diagnosis not present

## 2024-07-28 DIAGNOSIS — E042 Nontoxic multinodular goiter: Secondary | ICD-10-CM | POA: Diagnosis not present

## 2024-07-31 DIAGNOSIS — E041 Nontoxic single thyroid nodule: Secondary | ICD-10-CM | POA: Diagnosis not present

## 2024-08-27 DIAGNOSIS — L03012 Cellulitis of left finger: Secondary | ICD-10-CM | POA: Diagnosis not present

## 2024-10-01 DIAGNOSIS — H524 Presbyopia: Secondary | ICD-10-CM | POA: Diagnosis not present

## 2024-10-01 DIAGNOSIS — H52223 Regular astigmatism, bilateral: Secondary | ICD-10-CM | POA: Diagnosis not present

## 2024-10-01 DIAGNOSIS — H52209 Unspecified astigmatism, unspecified eye: Secondary | ICD-10-CM | POA: Diagnosis not present

## 2024-10-01 DIAGNOSIS — H5203 Hypermetropia, bilateral: Secondary | ICD-10-CM | POA: Diagnosis not present
